# Patient Record
Sex: Female | Born: 1985 | Race: Black or African American | Hispanic: No | Marital: Single | Smoking: Never smoker
Health system: Southern US, Community
[De-identification: ages and names within clinical notes are randomized; demographics above are authoritative.]

## PROBLEM LIST (undated history)

## (undated) DIAGNOSIS — K219 Gastro-esophageal reflux disease without esophagitis: Secondary | ICD-10-CM

## (undated) DIAGNOSIS — E669 Obesity, unspecified: Secondary | ICD-10-CM

## (undated) DIAGNOSIS — R7303 Prediabetes: Secondary | ICD-10-CM

## (undated) HISTORY — DX: Obesity, unspecified: E66.9

## (undated) HISTORY — DX: Gastro-esophageal reflux disease without esophagitis: K21.9

## (undated) HISTORY — DX: Prediabetes: R73.03

---
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
## 2003-02-09 ENCOUNTER — Ambulatory Visit (HOSPITAL_COMMUNITY): Admission: RE | Admit: 2003-02-09 | Discharge: 2003-02-09 | Payer: Self-pay | Admitting: Obstetrics and Gynecology

## 2003-02-09 ENCOUNTER — Encounter: Payer: Self-pay | Admitting: Obstetrics and Gynecology

## 2003-03-11 ENCOUNTER — Other Ambulatory Visit: Admission: RE | Admit: 2003-03-11 | Discharge: 2003-03-11 | Payer: Self-pay | Admitting: Obstetrics and Gynecology

## 2003-06-05 ENCOUNTER — Inpatient Hospital Stay (HOSPITAL_COMMUNITY): Admission: AD | Admit: 2003-06-05 | Discharge: 2003-06-08 | Payer: Self-pay | Admitting: Obstetrics and Gynecology

## 2003-06-05 ENCOUNTER — Encounter: Payer: Self-pay | Admitting: Obstetrics and Gynecology

## 2005-05-08 ENCOUNTER — Other Ambulatory Visit: Admission: RE | Admit: 2005-05-08 | Discharge: 2005-05-08 | Payer: Self-pay | Admitting: Obstetrics and Gynecology

## 2005-05-18 ENCOUNTER — Encounter: Admission: RE | Admit: 2005-05-18 | Discharge: 2005-08-16 | Payer: Self-pay | Admitting: Obstetrics and Gynecology

## 2005-12-12 ENCOUNTER — Emergency Department (HOSPITAL_COMMUNITY): Admission: EM | Admit: 2005-12-12 | Discharge: 2005-12-12 | Payer: Self-pay | Admitting: Family Medicine

## 2005-12-25 ENCOUNTER — Ambulatory Visit: Payer: Self-pay | Admitting: Family Medicine

## 2006-05-16 ENCOUNTER — Other Ambulatory Visit: Admission: RE | Admit: 2006-05-16 | Discharge: 2006-05-16 | Payer: Self-pay | Admitting: Obstetrics and Gynecology

## 2008-11-02 ENCOUNTER — Inpatient Hospital Stay (HOSPITAL_COMMUNITY): Admission: RE | Admit: 2008-11-02 | Discharge: 2008-11-05 | Payer: Self-pay | Admitting: Obstetrics & Gynecology

## 2009-07-18 ENCOUNTER — Ambulatory Visit (HOSPITAL_COMMUNITY): Admission: RE | Admit: 2009-07-18 | Discharge: 2009-07-18 | Payer: Self-pay | Admitting: Obstetrics and Gynecology

## 2009-08-15 ENCOUNTER — Ambulatory Visit (HOSPITAL_COMMUNITY): Admission: RE | Admit: 2009-08-15 | Discharge: 2009-08-15 | Payer: Self-pay | Admitting: Obstetrics and Gynecology

## 2009-09-05 ENCOUNTER — Emergency Department (HOSPITAL_COMMUNITY): Admission: EM | Admit: 2009-09-05 | Discharge: 2009-09-05 | Payer: Self-pay | Admitting: Family Medicine

## 2009-09-12 ENCOUNTER — Inpatient Hospital Stay (HOSPITAL_COMMUNITY): Admission: AD | Admit: 2009-09-12 | Discharge: 2009-09-12 | Payer: Self-pay | Admitting: Obstetrics and Gynecology

## 2009-11-01 ENCOUNTER — Inpatient Hospital Stay (HOSPITAL_COMMUNITY): Admission: AD | Admit: 2009-11-01 | Discharge: 2009-11-03 | Payer: Self-pay | Admitting: Obstetrics and Gynecology

## 2010-02-13 ENCOUNTER — Encounter: Admission: RE | Admit: 2010-02-13 | Discharge: 2010-02-13 | Payer: Self-pay | Admitting: Obstetrics and Gynecology

## 2010-04-19 ENCOUNTER — Encounter: Admission: RE | Admit: 2010-04-19 | Discharge: 2010-07-18 | Payer: Self-pay | Admitting: Obstetrics and Gynecology

## 2010-12-24 LAB — CBC
HCT: 31.8 % — ABNORMAL LOW (ref 36.0–46.0)
HCT: 34.3 % — ABNORMAL LOW (ref 36.0–46.0)
Hemoglobin: 10.5 g/dL — ABNORMAL LOW (ref 12.0–15.0)
Hemoglobin: 11.3 g/dL — ABNORMAL LOW (ref 12.0–15.0)
MCHC: 32.9 g/dL (ref 30.0–36.0)
MCHC: 33 g/dL (ref 30.0–36.0)
MCV: 81.9 fL (ref 78.0–100.0)
MCV: 83.2 fL (ref 78.0–100.0)
Platelets: 238 10*3/uL (ref 150–400)
Platelets: 276 10*3/uL (ref 150–400)
RBC: 3.82 MIL/uL — ABNORMAL LOW (ref 3.87–5.11)
RBC: 4.19 MIL/uL (ref 3.87–5.11)
RDW: 14.3 % (ref 11.5–15.5)
RDW: 15.1 % (ref 11.5–15.5)
WBC: 7.8 10*3/uL (ref 4.0–10.5)
WBC: 9.9 10*3/uL (ref 4.0–10.5)

## 2010-12-24 LAB — RPR: RPR Ser Ql: NONREACTIVE

## 2011-01-22 LAB — RPR: RPR Ser Ql: NONREACTIVE

## 2011-01-22 LAB — CBC
HCT: 31.5 % — ABNORMAL LOW (ref 36.0–46.0)
HCT: 33.1 % — ABNORMAL LOW (ref 36.0–46.0)
Hemoglobin: 10.5 g/dL — ABNORMAL LOW (ref 12.0–15.0)
Hemoglobin: 11 g/dL — ABNORMAL LOW (ref 12.0–15.0)
MCHC: 33.2 g/dL (ref 30.0–36.0)
MCHC: 33.3 g/dL (ref 30.0–36.0)
MCV: 82.7 fL (ref 78.0–100.0)
MCV: 82.7 fL (ref 78.0–100.0)
Platelets: 212 10*3/uL (ref 150–400)
Platelets: 254 10*3/uL (ref 150–400)
RBC: 3.81 MIL/uL — ABNORMAL LOW (ref 3.87–5.11)
RBC: 4 MIL/uL (ref 3.87–5.11)
RDW: 13.9 % (ref 11.5–15.5)
RDW: 14.1 % (ref 11.5–15.5)
WBC: 8.5 10*3/uL (ref 4.0–10.5)
WBC: 9.6 10*3/uL (ref 4.0–10.5)

## 2011-02-20 NOTE — H&P (Signed)
NAME:  DEVONNA, OBOYLE             ACCOUNT NO.:  [id number]   MEDICAL RECORD NO.:  [id number]          PATIENT TYPE:  INP   LOCATION:  9168                          FACILITY:  [facility]   PHYSICIAN:  Osborn Coho, M.D.   DATE OF BIRTH:  08/22/1986   DATE OF ADMISSION:  11/02/2008  DATE OF DISCHARGE:                              HISTORY & PHYSICAL   Ms. Mcclaran is a 25 year old G4, P 2-0-1-2 at 40.2 weeks who presents  today for induction of labor secondary to history of LGA at 9 pounds 13  ounces.  Ms. Willingham is followed by the midwives at [facility]  [facility].  Her pregnancy is remarkable for:  1. Increased BMI of May with a current weight at about 290 pounds.  2. 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  3. History of LGA at 9 pounds 13 ounces.  4. Positive sickle cell trait.  5. History of STDs.  6. Recurrent UTIs this pregnancy.   PRENATAL LABS:  Ms. Lowy's prenatal labs include initial hemoglobin of  11.4, hematocrit 35.1, platelets 246.  Blood type O positive, antibody  screen negative, positive sickle cell trait, nonreactive RPR, rubella  immune, hepatitis B negative, HIV nonreactive.  Her glucose challenge at  27 weeks was normal with the finding of 97.  Her chlamydia, gonorrhea  and GBS cultures were negative at 35 weeks.   HISTORY OF PRESENT PREGNANCY:  Ms. Scibilia entered prenatal care at [facility]  in the second trimester at 14-1/2 weeks.  She did on combined oral  contraceptives, Seasonale.  She did have some vaginal spotting an  urinary tract of infection at 17 weeks.  She had a normal anatomy scan  at 19 weeks with a complete anatomy seen on cardiac, three-vessel cord,  placenta away from the os and size equal to dates with the with the  fetus.  She had issues with weight gain being very erratic.  She would  gain 20 pounds in a month and then she would use some weight.  She  really had trouble with her nutrition during pregnancy.  At 27 weeks and  she  had a Glucola which was normal at 97 and an H1N1 vaccine.  At 35  weeks she had the negative cultures and an ultrasound for size greater  than dates which showed estimated fetal weight 5 pounds 5 ounces which  was at 46the percentile; AFI 12.4, 35th percentile and a BPP of 8/8.  She just carries her weight mostly in her middle section which was  giving [place] difficulties in assessing the fundal height.  Last month of  pregnancy has been unremarkable for Ms. Hartt.  She was concerned about  going past her due date and having a baby that might weight was than 10  pounds, so on January 19 we decided to go ahead and put her down for an  induction today and since her cervix was closed and soft and posterior  we are beginning with the Cytotec.   OB HISTORY:  Gravida #1 for Ms. Arnett resulted in a son in July 2003.  He  was born at 42 weeks vaginally without complications.  Rhett Bannister #2 was  another son born in August 2004 at 40 weeks, born vaginally without  complications weighing 9 pounds 13 ounces.  Rhett Bannister #3 was a loss around  6 weeks' gestation in January 2009.  Rhett Bannister #4 is current pregnancy.   ALLERGIES:  Ms. Amoroso has no known medication allergies, food allergies  or latex allergies.   MEDICAL HISTORY:  Ms. Lein has a history of abnormal Pap in the past  year with CIN-I and positive high-risk HPV.  She is morbidly obese at  approximately 290 pounds and 5 feet 5 inches tall.  She is positive for  sickle cell trait.  She has a history of sexually transmitted diseases.   SURGICAL HISTORY:  She has never had surgery.   FAMILY HISTORY:  Her maternal grandmother has chronic hypertension and  diabetes the requires insulin management.   GENETIC HISTORY:  She is positive for sickle cell trait.  Father of the  baby has no contributory genetic history.   SOCIAL HISTORY:  She is a single [place] American woman who is currently  biology major and college.  The father of baby is listed as  Kathreen Cosier.  There is no other information listed for him.  She denies any  use of alcohol, tobacco products or street drugs during this pregnancy.  She does not state a religion.   PHYSICAL EXAMINATION:  She is afebrile with stable vital signs.  HEENT:  Within normal limits.  LUNGS:  Clear to auscultation bilaterally.  HEART:  Regular rate and rhythm.  No murmur.  EXTREMITIES:  Without edema.  DTRs +2, +2.  Negative Homan's.  Positive fetal movement.  Fetal heart rate 135, reactive, reassuring.  Irregular mild contractions of the uterus.  Vaginal exam closed, soft,  posterior 30% effaced.   IMPRESSION:  A 25 year old G4, P 2-0-1-2 at 40.2 weeks with a history of  large for gestational age infant, okay for induction of labor with  Cytotec.   PLAN:  Cytotec 200 mcg and posterior fornix q.4 hours until no Bishop  score reasonable for transition to Pitocin.  Anticipate NSVD later on  today.      Eulogio Bear, CNM      Osborn Coho, M.D.  Electronically Signed    JM/MEDQ  D:  11/02/2008  T:  11/02/2008  Job:  [id number]

## 2011-02-23 NOTE — H&P (Signed)
[facility]  Patient:    Morgan Dudley, Morgan Dudley Visit Number: [id number] MRN: [id number]          Service Type: OBS Location: [id number] [id number] Attending Physician:  Jaymes Graff A Dictated by:   Saverio Danker, C.N.M. Admit Date:  04/10/2002                           History and Physical  HISTORY OF PRESENT ILLNESS:   The patient is a 25 year old single black female, gravida 1, para 0, at 41-5/7ths weeks by 30-week ultrasound who is admitted for induction of labor secondary to postdates.  She denies any regular uterine contractions.  She denies any nausea, vomiting, headaches or visual disturbances.  She denies any leaking or vaginal bleeding.  She reports positive fetal movement.  Her pregnancy has been followed at [facility] since about [redacted] weeks gestation and has been at risk for (1) limited prenatal care, (2) adolescence, (3) positive sickle cell trait, (4) history of positive Chlamydia at 30 weeks that was treated, and (5) unknown LMP.  Her group B strep is negative.  OBSTETRICAL/GYNECOLOGICAL HISTORY:                      She is a gravida 1, para 0, with an LMP somewhere in December of 2002 with a history of very irregular periods and her EDC is established by 30-week ultrasound and is March 29, 2002.  OTHER GYNECOLOGICAL HISTORY:  She was treated for Chlamydia with this pregnancy, and she has no other significant GYN history.  GENERAL MEDICAL HISTORY:      She has no known drug allergies.  She reports not being aware of whether she has had the usual childhood diseases.  She has no other medical problems that she admits to, though she did test positive for sickle cell trait.  FAMILY HISTORY:               Significant for maternal grandmother and grandfather with hypertension, on medications; maternal grandmother with anemia; maternal grandmother with adult-onset diabetes, on diet and p.o. medications; a maternal aunt with thyroid  disease; maternal grandmother with migraine headaches.  GENETIC HISTORY:              Negative.  SOCIAL HISTORY:               She is single, she has good support from her family.  She is currently a Consulting civil engineer at [employer].  She denies any illicit drug use, alcohol or smoking with this pregnancy.  PRENATAL LABORATORIES:        Her blood type is O positive.  Her antibody screen is negative.  Sickle cell trait is positive.  Syphilis is nonreactive. Rubella is immune.  Hepatitis B surface antigen is negative.  HIV is nonreactive.  GC was negative.  Chlamydia was positive and she was treated for that; the tests of cures are not on the current chart at this time.  Her Pap was negative with the exception of yeast.  Her one-hour Glucola was 100 and her 36-week beta strep was negative.  PHYSICAL EXAMINATION:  VITAL SIGNS:                  Her vital signs are stable, she is afebrile.  HEENT:  Grossly within normal limits.  HEART:                        Regular rhythm and rate.  CHEST:                        Clear.  BREASTS:                      Soft and nontender.  ABDOMEN:                      Gravid with very irregular mild uterine contractions.  Her fetal heart rate is reactive and reassuring.  GENITALIA:                    Her pelvic exam is 1 cm, thick, posterior and -2.  EXTREMITIES:                  Within normal limits.  ASSESSMENT: 1. Intrauterine pregnancy at 41-5/7ths weeks. 2. Unfavorable cervix. 3. Negative group B Streptococcus. 4. Sickle cell trait.  PLAN:                         Admit to labor and delivery for induction of labor.  The risks and benefits of the induction have been reviewed with the patient including the possibility of fetal distress and increased risk of cesarean section and the risk of postdates pregnancy were also reviewed.  The patient desires to proceed with induction.  Dr. Normand Sloop is notified of the patients  admission and induction plan. Dictated by:   Vance Gather Duplantis, C.N.M. Attending Physician:  Michael Litter DD:  04/10/02 TD:  04/11/02 Job: [id number]

## 2011-02-23 NOTE — H&P (Signed)
NAME:  Morgan Dudley, Morgan Dudley                       ACCOUNT NO.:  [id number]   MEDICAL RECORD NO.:  [id number]                   PATIENT TYPE:  INP   LOCATION:  9168                                 FACILITY:  [facility]   PHYSICIAN:  Naima A. Dillard, M.D.              DATE OF BIRTH:  Mar 13, 1986   DATE OF ADMISSION:  06/05/2003  DATE OF DISCHARGE:                                HISTORY & PHYSICAL   HISTORY:  This is an 25 year old, gravida 2, para 1-0-0-1 at 79 3/7 weeks  who presents for elective induction of labor secondary to favorable cervix  and macrosomia. Pregnancy has been followed by the nurse midwife service and  remarkable for 1) unsure dates, 2) late to care, 3) second trimester  Chlamydia with a negative test of cure, 4) sickle cell trait, 5) obesity, 6)  second pregnancy in less than 12 months, 7) group B strep negative.   OB HISTORY:  Remarkable for vaginal delivery in 2003 of a female infant at [redacted]  weeks gestation, weighing 7 pounds, 13 ounces with no complications other  than post date.   MEDICAL HISTORY:  Remarkably for second trimester Chlamydia which was  treated with Zithromax and had a negative test of cure.   FAMILY HISTORY:  Remarkable for a grandmother and grandfather with  hypertension, grandmother with diabetes and an aunt with thyroid disease.   PAST SURGICAL HISTORY:  Negative.   GENETIC HISTORY:  Unremarkable.   SOCIAL HISTORY:  The patient is single. The father of the baby, Jamar, is  reported to be supportive but is not currently present. She has two female  family members present. She does not report a religious affiliation and she  denies any alcohol, tobacco or drug use.   PRENATAL LABS:  Hemoglobin 11.2, platelets 259, blood type O positive,  antibiotic screen negative, sickle cell positive for trait, RPR nonreactive,  rubella immune, HBsAg negative, HIV negative. Pap test normal, gonorrhea  negative. Chlamydia negative after treatment. Group B strep  negative.  Glucola within normal limits. Quad screen deferred secondary to late care.   PHYSICAL EXAMINATION:  VITAL SIGNS:  Stable.  HEENT:  Within normal limits.  NECK:  Thyroid normal, not enlarged.  CHEST:  Clear to auscultation.  HEART:  Regular rate and rhythm.  ABDOMEN:  Gravid, vertex to Leopold's. Fetal monitor denotes reactive fetal  heart rate with uterine contractions every 2-4 minutes which are mild.  Ultrasound denotes EFW of 3752 to 3909 g. Placenta is anterior, grade 2,  cephalic presentation and AFI is 11.7.  CERVIX:  Cervical exam is to 70%, -1, -2, vertex soft and mid position.  EXTREMITIES:  Within normal limits.   ASSESSMENT:  1. Intrauterine pregnancy at term.  2. Macrosomia.  3. Fetal renal pyelectasis.  4. Desires elective induction of labor secondary to suspected macrosomia.   PLAN:  1. Admit to birthing suites, discussed with Dr. Normand Sloop.  2. Amniotomy  performed for clear fluid.  3. Intrauterine pressure catheter to monitor adequacy of labor.  4. Pitocin p.r.n. inadequate labor.  5. Epidural p.r.n.  6. Further orders to follow.     Marie L. Williams, C.N.M.                 Naima A. Normand Sloop, M.D.    MLW/MEDQ  D:  06/06/2003  T:  06/06/2003  Job:  [id number]

## 2011-04-16 IMAGING — US US OB FOLLOW-UP
1 series · 18 of 28 positions shown · non-contrast
Comparison: none

OBSTETRICAL ULTRASOUND:
 This ultrasound was performed in The [HOSPITAL], and the [facility] report will be stored to [REDACTED] PACS.  This report is also available in [HOSPITAL]?s accessANYware.

[Series 1: us ob follow-up · 84 acquisitions, 18 frames shown]
[im 1/84]
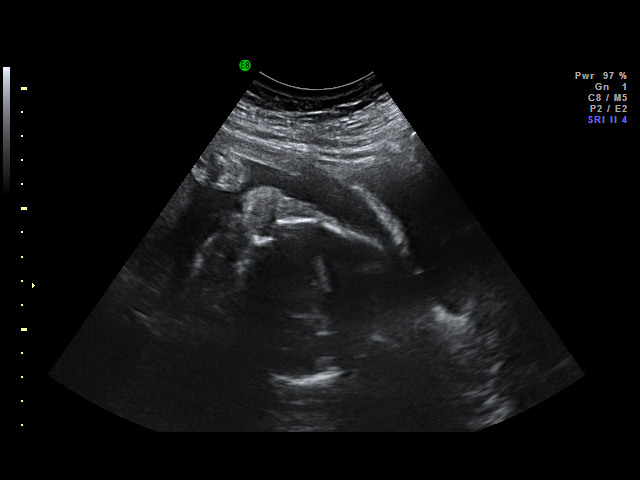
[im 7/84]
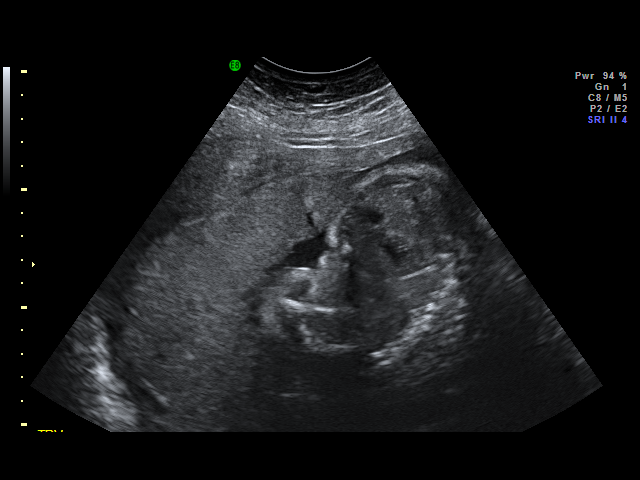
[im 10/84]
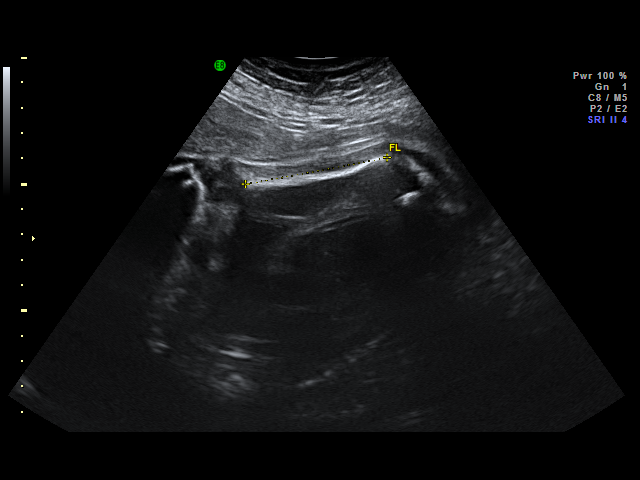
[im 16/84]
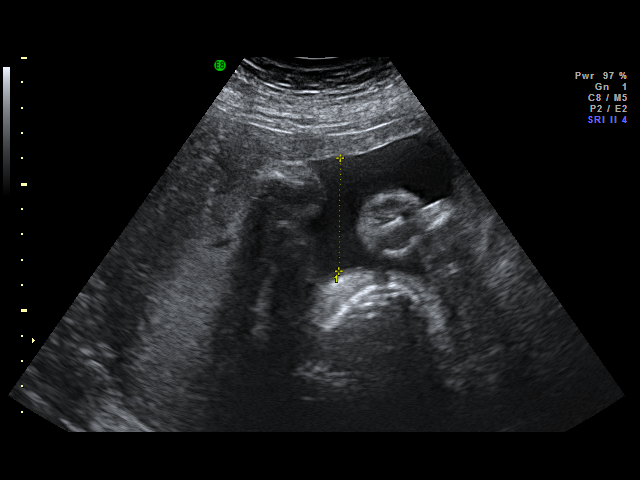
[im 22/84]
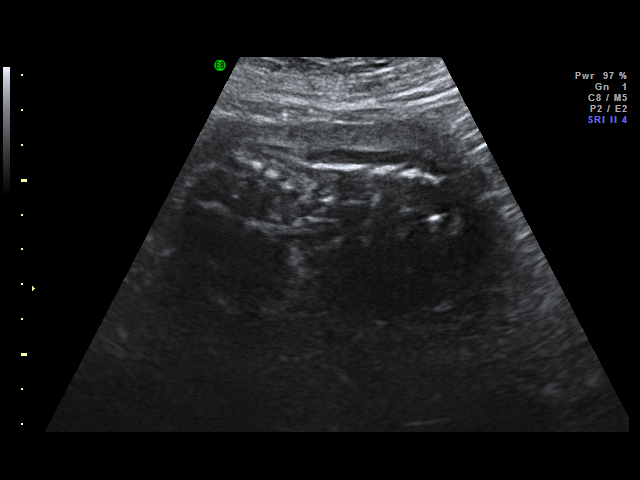
[im 25/84]
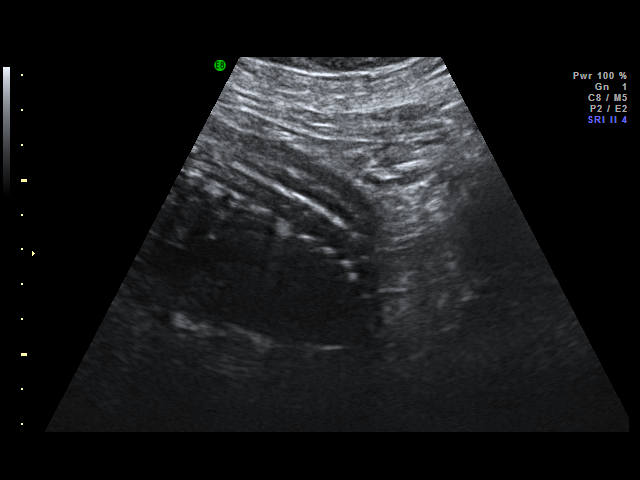
[im 31/84]
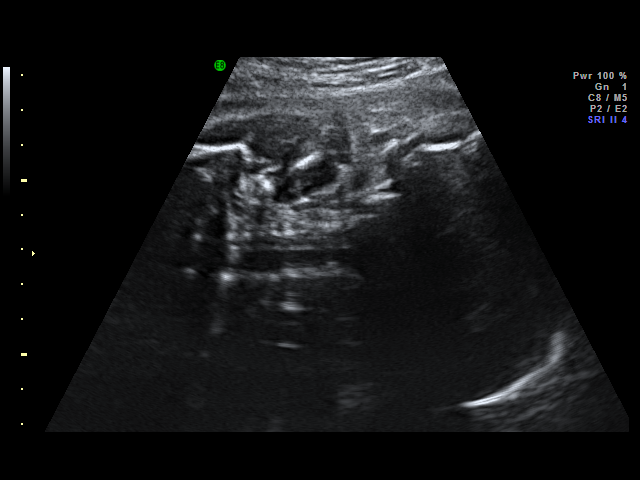
[im 34/84]
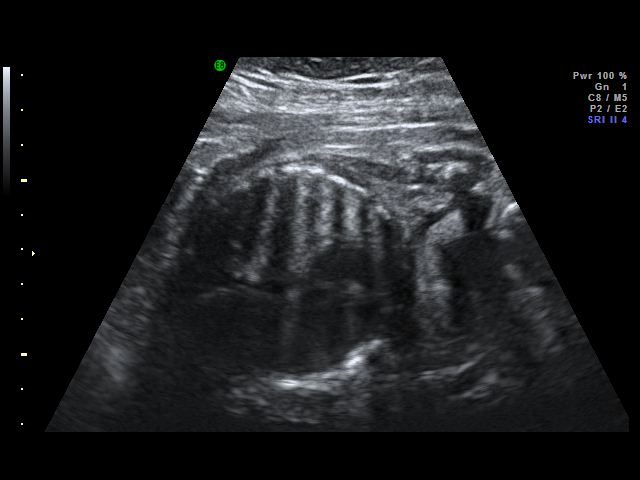
[im 40/84]
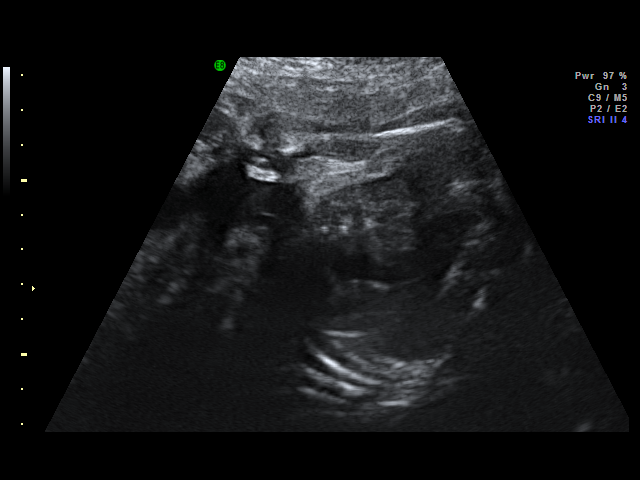
[im 44/84]
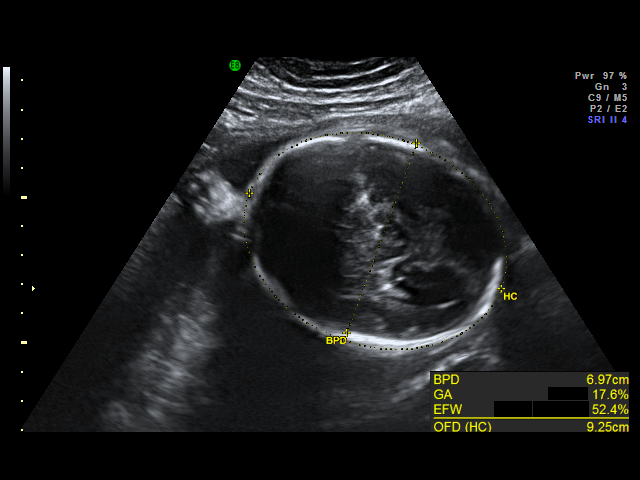
[im 50/84]
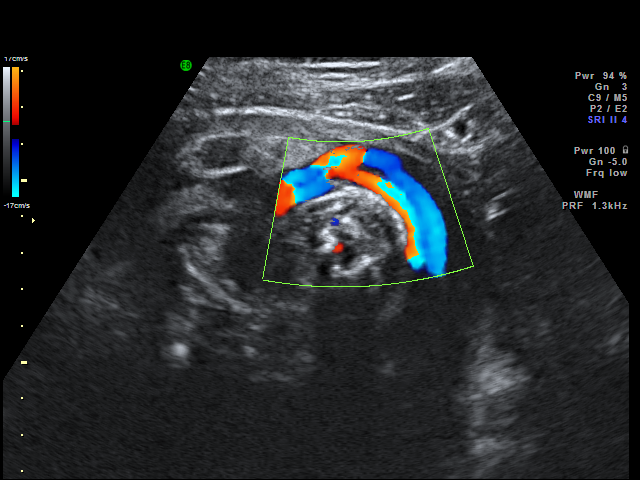
[im 53/84]
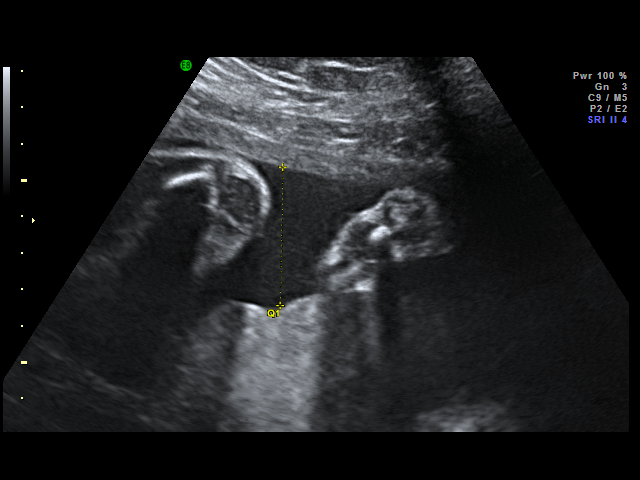
[im 59/84]
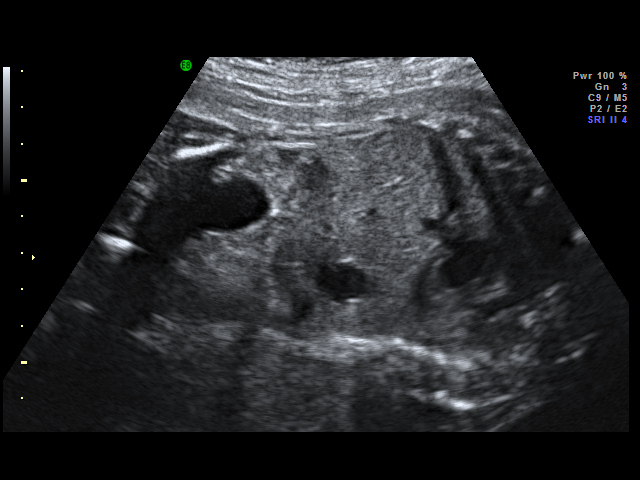
[im 65/84]
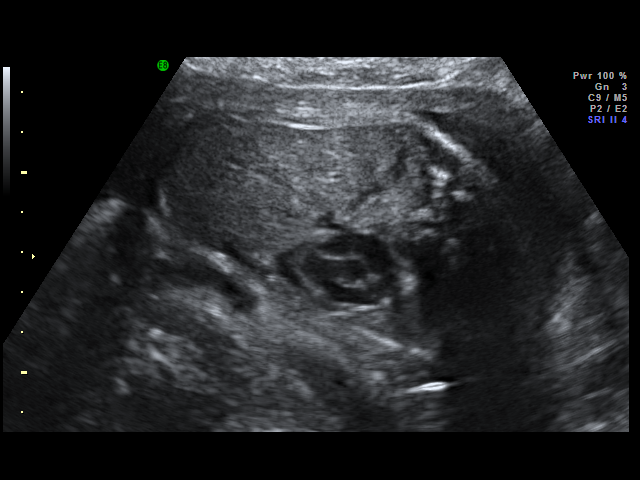
[im 68/84]
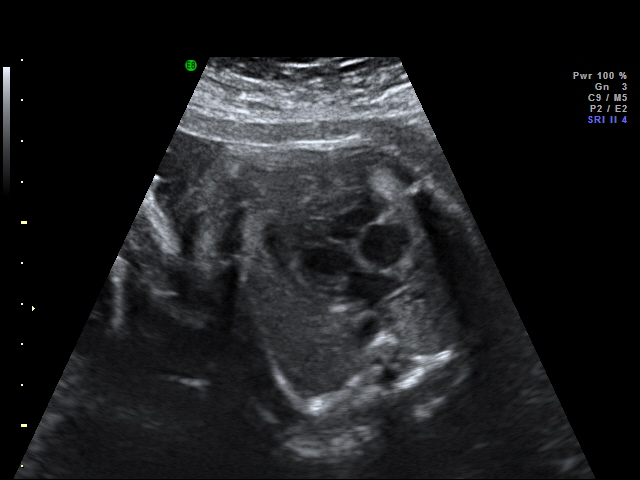
[im 74/84]
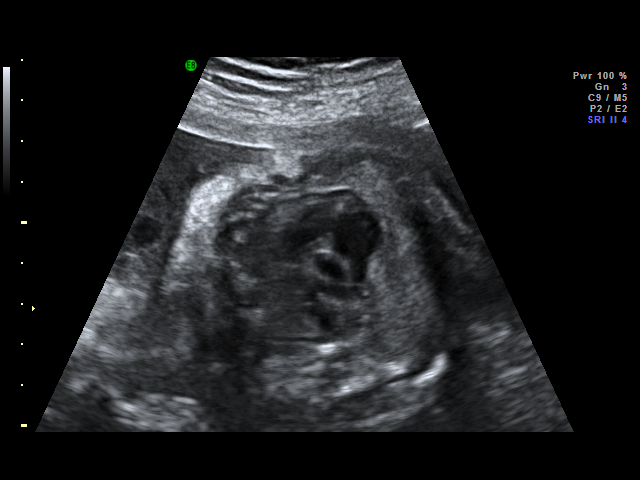
[im 77/84]
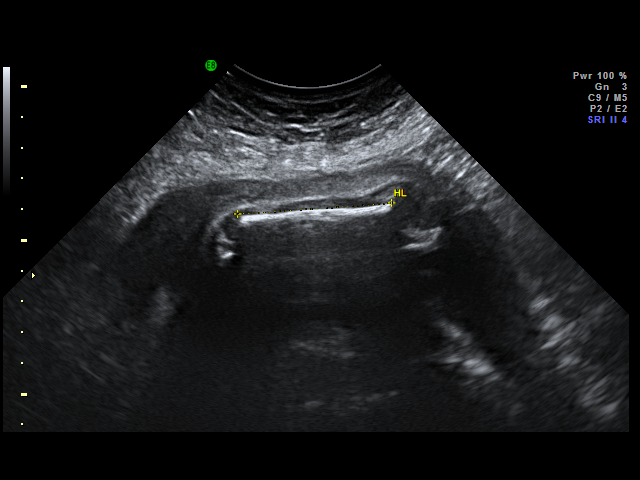
[im 84/84]
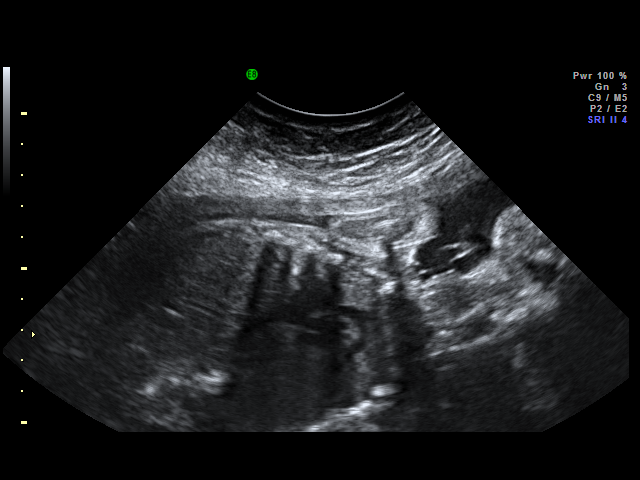

[18 of 28 positions shown; findings below may reference images not displayed]

IMPRESSION: [facility] has also been faxed to the ordering physician.

## 2011-08-13 ENCOUNTER — Ambulatory Visit: Payer: Self-pay | Admitting: *Deleted

## 2011-08-14 ENCOUNTER — Encounter: Payer: Self-pay | Admitting: *Deleted

## 2013-07-22 ENCOUNTER — Encounter: Payer: Self-pay | Admitting: Obstetrics

## 2013-08-11 ENCOUNTER — Ambulatory Visit: Payer: Self-pay | Admitting: Advanced Practice Midwife

## 2018-09-17 ENCOUNTER — Ambulatory Visit: Payer: Self-pay | Admitting: Family Medicine

## 2018-09-25 ENCOUNTER — Encounter: Payer: Self-pay | Admitting: Family Medicine

## 2018-09-25 ENCOUNTER — Ambulatory Visit (INDEPENDENT_AMBULATORY_CARE_PROVIDER_SITE_OTHER): Payer: Self-pay | Admitting: Family Medicine

## 2018-09-25 VITALS — BP 129/70 | HR 83 | Resp 17 | Ht 66.0 in | Wt 310.0 lb

## 2018-09-25 DIAGNOSIS — Z7689 Persons encountering health services in other specified circumstances: Secondary | ICD-10-CM

## 2018-09-25 DIAGNOSIS — Z23 Encounter for immunization: Secondary | ICD-10-CM

## 2018-09-25 MED ORDER — BUPROPION HCL ER (XL) 150 MG PO TB24: 150.0000 mg | ORAL_TABLET | Freq: Every day | ORAL | 2 refills | Status: DC

## 2018-09-25 MED FILL — BUPROPION HCL XL 150 MG TAB: 150 | 30 days supply | Qty: 30 | Fill #0

## 2018-09-25 NOTE — Patient Instructions (Addendum)
Thank you for choosing Primary Care at [facility] to be your medical home!    Morgan Dudley was seen by Joaquin Courts, FNP today.   Morgan Dudley's primary care provider is Bing Neighbors, FNP.   For the best care possible, you should try to see Joaquin Courts, FNP-C whenever you come to the clinic.   We look forward to seeing you again soon!  If you have any questions about your visit today, please call us at [phone] or feel free to reach your primary care provider via MyChart.      Calorie Counting for Weight Loss Calories are units of energy. Your body needs a certain amount of calories from food to keep you going throughout the day. When you eat more calories than your body needs, your body stores the extra calories as fat. When you eat fewer calories than your body needs, your body burns fat to get the energy it needs. Calorie counting means keeping track of how many calories you eat and drink each day. Calorie counting can be helpful if you need to lose weight. If you make sure to eat fewer calories than your body needs, you should lose weight. Ask your health care provider what a healthy weight is for you. For calorie counting to work, you will need to eat the right number of calories in a day in order to lose a healthy amount of weight per week. A dietitian can help you determine how many calories you need in a day and will give you suggestions on how to reach your calorie goal.  A healthy amount of weight to lose per week is usually 1-2 lb (0.5-0.9 kg). This usually means that your daily calorie intake should be reduced by 500-750 calories.  Eating 1,200 - 1,500 calories per day can help most women lose weight.  Eating 1,500 - 1,800 calories per day can help most men lose weight. What is my plan? My goal is to have __________ calories per day. If I have this many calories per day, I should lose around __________ pounds per week. What do I need to know about  calorie counting? In order to meet your daily calorie goal, you will need to:  Find out how many calories are in each food you would like to eat. Try to do this before you eat.  Decide how much of the food you plan to eat.  Write down what you ate and how many calories it had. Doing this is called keeping a food log. To successfully lose weight, it is important to balance calorie counting with a healthy lifestyle that includes regular activity. Aim for 150 minutes of moderate exercise (such as walking) or 75 minutes of vigorous exercise (such as running) each week. Where do I find calorie information?  The number of calories in a food can be found on a Nutrition Facts label. If a food does not have a Nutrition Facts label, try to look up the calories online or ask your dietitian for help. Remember that calories are listed per serving. If you choose to have more than one serving of a food, you will have to multiply the calories per serving by the amount of servings you plan to eat. For example, the label on a package of bread might say that a serving size is 1 slice and that there are 90 calories in a serving. If you eat 1 slice, you will have eaten 90 calories. If you eat 2  slices, you will have eaten 180 calories. How do I keep a food log? Immediately after each meal, record the following information in your food log:  What you ate. Don't forget to include toppings, sauces, and other extras on the food.  How much you ate. This can be measured in cups, ounces, or number of items.  How many calories each food and drink had.  The total number of calories in the meal. Keep your food log near you, such as in a small notebook in your pocket, or use a mobile app or website. Some programs will calculate calories for you and show you how many calories you have left for the day to meet your goal. What are some calorie counting tips?   Use your calories on foods and drinks that will fill you up and  not leave you hungry: ? Some examples of foods that fill you up are nuts and nut butters, vegetables, lean proteins, and high-fiber foods like whole grains. High-fiber foods are foods with more than 5 g fiber per serving. ? Drinks such as sodas, specialty coffee drinks, alcohol, and juices have a lot of calories, yet do not fill you up.  Eat nutritious foods and avoid empty calories. Empty calories are calories you get from foods or beverages that do not have many vitamins or protein, such as candy, sweets, and soda. It is better to have a nutritious high-calorie food (such as an avocado) than a food with few nutrients (such as a bag of chips).  Know how many calories are in the foods you eat most often. This will help you calculate calorie counts faster.  Pay attention to calories in drinks. Low-calorie drinks include water and unsweetened drinks.  Pay attention to nutrition labels for "low fat" or "fat free" foods. These foods sometimes have the same amount of calories or more calories than the full fat versions. They also often have added sugar, starch, or salt, to make up for flavor that was removed with the fat.  Find a way of tracking calories that works for you. Get creative. Try different apps or programs if writing down calories does not work for you. What are some portion control tips?  Know how many calories are in a serving. This will help you know how many servings of a certain food you can have.  Use a measuring cup to measure serving sizes. You could also try weighing out portions on a kitchen scale. With time, you will be able to estimate serving sizes for some foods.  Take some time to put servings of different foods on your favorite plates, bowls, and cups so you know what a serving looks like.  Try not to eat straight from a bag or box. Doing this can lead to overeating. Put the amount you would like to eat in a cup or on a plate to make sure you are eating the right  portion.  Use smaller plates, glasses, and bowls to prevent overeating.  Try not to multitask (for example, watch TV or use your computer) while eating. If it is time to eat, sit down at a table and enjoy your food. This will help you to know when you are full. It will also help you to be aware of what you are eating and how much you are eating. What are tips for following this plan? Reading food labels  Check the calorie count compared to the serving size. The serving size may be smaller than what  you are used to eating.  Check the source of the calories. Make sure the food you are eating is high in vitamins and protein and low in saturated and trans fats. Shopping  Read nutrition labels while you shop. This will help you make healthy decisions before you decide to purchase your food.  Make a grocery list and stick to it. Cooking  Try to cook your favorite foods in a healthier way. For example, try baking instead of frying.  Use low-fat dairy products. Meal planning  Use more fruits and vegetables. Half of your plate should be fruits and vegetables.  Include lean proteins like poultry and fish. How do I count calories when eating out?  Ask for smaller portion sizes.  Consider sharing an entree and sides instead of getting your own entree.  If you get your own entree, eat only half. Ask for a box at the beginning of your meal and put the rest of your entree in it so you are not tempted to eat it.  If calories are listed on the menu, choose the lower calorie options.  Choose dishes that include vegetables, fruits, whole grains, low-fat dairy products, and lean protein.  Choose items that are boiled, broiled, grilled, or steamed. Stay away from items that are buttered, battered, fried, or served with cream sauce. Items labeled "crispy" are usually fried, unless stated otherwise.  Choose water, low-fat milk, unsweetened iced tea, or other drinks without added sugar. If you want  an alcoholic beverage, choose a lower calorie option such as a glass of wine or light beer.  Ask for dressings, sauces, and syrups on the side. These are usually high in calories, so you should limit the amount you eat.  If you want a salad, choose a garden salad and ask for grilled meats. Avoid extra toppings like bacon, cheese, or fried items. Ask for the dressing on the side, or ask for olive oil and vinegar or lemon to use as dressing.  Estimate how many servings of a food you are given. For example, a serving of cooked rice is  cup or about the size of half a baseball. Knowing serving sizes will help you be aware of how much food you are eating at restaurants. The list below tells you how big or small some common portion sizes are based on everyday objects: ? 1 oz-4 stacked dice. ? 3 oz-1 deck of cards. ? 1 tsp-1 die. ? 1 Tbsp- a ping-pong ball. ? 2 Tbsp-1 ping-pong ball. ?  cup- baseball. ? 1 cup-1 baseball. Summary  Calorie counting means keeping track of how many calories you eat and drink each day. If you eat fewer calories than your body needs, you should lose weight.  A healthy amount of weight to lose per week is usually 1-2 lb (0.5-0.9 kg). This usually means reducing your daily calorie intake by 500-750 calories.  The number of calories in a food can be found on a Nutrition Facts label. If a food does not have a Nutrition Facts label, try to look up the calories online or ask your dietitian for help.  Use your calories on foods and drinks that will fill you up, and not on foods and drinks that will leave you hungry.  Use smaller plates, glasses, and bowls to prevent overeating. This information is not intended to replace advice given to you by your health care provider. Make sure you discuss any questions you have with your health care provider. Document Released: 09/24/2005  Document Revised: 06/13/2018 Document Reviewed: 08/24/2016 Elsevier Interactive Patient Education   [employer]2019 [employer]

## 2018-09-25 NOTE — Progress Notes (Signed)
Morgan BuergerVictoria Dudley, is a 32 y.o. female  YNW:[id number]:[id number]  MVH:[id number]:[id number]  DOB - 1986/06/12  CC:  Chief Complaint  Patient presents with  . Establish Care  . Obesity       HPI: Morgan Dudley is a 32 y.o. female is here today to establish care.   Morgan Dudley does not have a problem list on file.    Today's visit:  Obesity: Patient is concern of current level of obesity. Patient cites health as primary reason for wanting to lose weight.  Current Body mass index is 50.04 kg/m.  Obesity History Weight in late teens:160-170  lb. Period of greatest weight gain:  later adolescence after first pregnancy. Weight increased with each pregnancy Lowest adult weight: 200 Highest adult weight: current weight, 310 lbs Amount of time at present weight: > 10 year    History of Weight Loss Efforts Greatest amount of weight lost: 10-15 lb over few months Amount of time that loss was maintained: minimal months Circumstances associated with regain of weight: currently a full-time parent and full-time Consulting civil engineerstudent. Successful weight loss techniques attempted: exercise and diet shakes. Max weight loss 15 lbs and regain all of loss weight.  Current Exercise Habits cardiovascular workout on exercise equipment  Current Eating Habits Number of regular meals per day: 3 Number of snacking episodes per day: ocassionally  Binge behavior?: stress and need for use of hands, increases snacking Purge behavior? no Anorexic behavior? no Eating precipitated by stress? yes   No history of thyroid disease or any other metabolic disorder. Patient denies new headaches, chest pain, abdominal pain, nausea, new weakness , numbness or tingling, SOB, edema, or worrisome cough.   Current medications:None    Pertinent family medical history: mother prediabetes    No Known Allergies  Social History   Socioeconomic History  . Marital status: Single    Spouse name: Not on file  . Number of children: Not on file  .  Years of education: Not on file  . Highest education level: Not on file  Occupational History  . Not on file  Social Needs  . Financial resource strain: Not on file  . Food insecurity:    Worry: Not on file    Inability: Not on file  . Transportation needs:    Medical: Not on file    Non-medical: Not on file  Tobacco Use  . Smoking status: Never Smoker  . Smokeless tobacco: Never Used  Substance and Sexual Activity  . Alcohol use: Not Currently  . Drug use: Never  . Sexual activity: Not on file  Lifestyle  . Physical activity:    Days per week: Not on file    Minutes per session: Not on file  . Stress: Not on file  Relationships  . Social connections:    Talks on phone: Not on file    Gets together: Not on file    Attends religious service: Not on file    Active member of club or organization: Not on file    Attends meetings of clubs or organizations: Not on file    Relationship status: Not on file  . Intimate partner violence:    Fear of current or ex partner: Not on file    Emotionally abused: Not on file    Physically abused: Not on file    Forced sexual activity: Not on file  Other Topics Concern  . Not on file  Social History Narrative  . Not on file    Review of  Systems: Constitutional: Negative for fever, chills, diaphoresis, activity change, appetite change and fatigue. Respiratory: Negative for cough, choking, chest tightness, shortness of breath, wheezing and stridor.  Cardiovascular: Negative for chest pain, palpitations and leg swelling. Gastrointestinal: Negative for abdominal distention, nausea, vomiting, constipation Genitourinary: Negative for dysuria, urgency, frequency, hematuria, flank pain, decreased urine volume, difficulty urinating. Psychiatric/Behavioral: Negative for hallucinations, behavioral problems, confusion, dysphoric mood, decreased concentration and agitation.  Objective:   Vitals:   09/25/18 1356  BP: 129/70  Pulse: 83  Resp:  17  SpO2: 96%    BP Readings from Last 3 Encounters:  09/25/18 129/70    Filed Weights   09/25/18 1356  Weight: 210 lb (95.3 kg)      Physical Exam: Constitutional: Patient appears well-developed and well-nourished. No distress. HENT: Normocephalic, atraumatic, External right and left ear normal. Oropharynx is clear and moist.  Eyes: Conjunctivae and EOM are normal. PERRLA, no scleral icterus. Neck: Normal ROM. Neck supple. No JVD. No tracheal deviation. No thyromegaly. CVS: RRR, S1/S2 +, no murmurs, no gallops, no carotid bruit.  Pulmonary: Effort and breath sounds normal, no stridor, rhonchi, wheezes, rales.  Abdominal: Soft. BS +, no distension, tenderness, rebound or guarding.  Musculoskeletal: Normal range of motion. No edema and no tenderness.  Neuro: Alert. Normal muscle tone coordination. Normal gait. BUE and BLE strength 5/5. Bilateral hand grips symmetrical. No cranial nerve deficit. Skin: Skin is warm and dry. No rash noted. Not diaphoretic. No erythema. No pallor. Psychiatric: Normal mood and affect. Behavior, judgment, thought content normal.  Lab Results (prior encounters)  Lab Results  Component Value Date   WBC 9.9 11/03/2009   HGB 10.5 (L) 11/03/2009   HCT 31.8 (L) 11/03/2009   MCV 83.2 11/03/2009   PLT 238 11/03/2009      Assessment and plan:  1. Encounter to establish care 2. Class 3 severe obesity in adult, unspecified BMI, unspecified obesity type, unspecified whether serious comorbidity present (HCC) Encouraged efforts to reduce weight include engaging in physical activity as tolerated with goal of 150 minutes per week. Improve dietary choices and eat a meal regimen consistent with a Mediterranean or DASH diet. Reduce simple carbohydrates. Do not skip meals and eat healthy snacks throughout the day to avoid over-eating at dinner. Set a goal weight loss that is achievable for you. Make dinner your smallest meal. Breakfast and lunch should be larger  meals. Avoid eating after 7:00 pm or less tham 3 to 4 hours of bedtime. -Trial Wellbutrin 150 mg once daily for weight loss  Checking the following labs: - Thyroid Panel With TSH - Hemoglobin A1c  3. Need for immunization against influenza - Flu Vaccine QUAD 36+ mos IM    Return in 6 weeks weight loss management    Meds ordered this encounter  Medications  . DISCONTD: buPROPion (WELLBUTRIN XL) 150 MG 24 hr tablet    Sig: Take 1 tablet (150 mg total) by mouth daily.    Dispense:  30 tablet    Refill:  2  . buPROPion (WELLBUTRIN XL) 150 MG 24 hr tablet    Sig: Take 1 tablet (150 mg total) by mouth daily.    Dispense:  30 tablet    Refill:  2    Orders Placed This Encounter  Procedures  . Flu Vaccine QUAD 36+ mos IM  . Thyroid Panel With TSH  . Hemoglobin A1c     The patient was given clear instructions to go to ER or return to medical center if  symptoms don't improve, worsen or new problems develop. The patient verbalized understanding. The patient was advised  to call and obtain lab results if they haven't heard anything from out office within 7-10 business days.  Joaquin CourtsKimberly Danyael Alipio, FNP [facility] [address], [place] [id number] [phone]: [phone]    This note has been created with Dragon speech recognition software and Paediatric nursesmart phrase technology. Any transcriptional errors are unintentional.

## 2018-09-26 LAB — HEMOGLOBIN A1C
Est. average glucose Bld gHb Est-mCnc: 120 mg/dL
Hgb A1c MFr Bld: 5.8 % — ABNORMAL HIGH (ref 4.8–5.6)

## 2018-09-26 LAB — THYROID PANEL WITH TSH
Free Thyroxine Index: 1.8 (ref 1.2–4.9)
T3 Uptake Ratio: 22 % — ABNORMAL LOW (ref 24–39)
T4, Total: 8 ug/dL (ref 4.5–12.0)
TSH: 0.495 u[IU]/mL (ref 0.450–4.500)

## 2018-10-07 NOTE — Progress Notes (Signed)
Patient notified of results & recommendations. Expressed understanding.

## 2018-10-29 MED FILL — BUPROPION HCL XL 150 MG TAB: 150 | 30 days supply | Qty: 30 | Fill #1

## 2018-11-06 ENCOUNTER — Encounter: Payer: Self-pay | Admitting: Family Medicine

## 2018-11-06 ENCOUNTER — Ambulatory Visit: Payer: Self-pay | Attending: Family Medicine

## 2018-11-06 ENCOUNTER — Ambulatory Visit (INDEPENDENT_AMBULATORY_CARE_PROVIDER_SITE_OTHER): Payer: Self-pay | Admitting: Family Medicine

## 2018-11-06 VITALS — BP 126/74 | HR 85 | Resp 17 | Ht 66.0 in | Wt 309.8 lb

## 2018-11-06 DIAGNOSIS — Z713 Dietary counseling and surveillance: Secondary | ICD-10-CM

## 2018-11-06 DIAGNOSIS — Z6841 Body Mass Index (BMI) 40.0 and over, adult: Secondary | ICD-10-CM

## 2018-11-06 DIAGNOSIS — Z3202 Encounter for pregnancy test, result negative: Secondary | ICD-10-CM

## 2018-11-06 DIAGNOSIS — Z3042 Encounter for surveillance of injectable contraceptive: Secondary | ICD-10-CM

## 2018-11-06 LAB — POCT URINE PREGNANCY: Preg Test, Ur: NEGATIVE

## 2018-11-06 MED ORDER — BUPROPION HCL ER (XL) 300 MG PO TB24: 300.0000 mg | ORAL_TABLET | Freq: Every day | ORAL | 5 refills | Status: DC

## 2018-11-06 MED ORDER — MEDROXYPROGESTERONE ACETATE 150 MG/ML IM SUSP
150.0000 mg | Freq: Once | INTRAMUSCULAR | Status: AC
  Administered 2018-11-06: 150 mg via INTRAMUSCULAR

## 2018-11-06 NOTE — Patient Instructions (Signed)
Contact me here at the office once your financial assistance is approved and I will refer you to medical weight management.    Exercising to Lose Weight Exercise is structured, repetitive physical activity to improve fitness and health. Getting regular exercise is important for everyone. It is especially important if you are overweight. Being overweight increases your risk of heart disease, stroke, diabetes, high blood pressure, and several types of cancer. Reducing your calorie intake and exercising can help you lose weight. Exercise is usually categorized as moderate or vigorous intensity. To lose weight, most people need to do a certain amount of moderate-intensity or vigorous-intensity exercise each week. Moderate-intensity exercise  Moderate-intensity exercise is any activity that gets you moving enough to burn at least three times more energy (calories) than if you were sitting. Examples of moderate exercise include:  Walking a mile in 15 minutes.  Doing light yard work.  Biking at an easy pace. Most people should get at least 150 minutes (2 hours and 30 minutes) a week of moderate-intensity exercise to maintain their body weight. Vigorous-intensity exercise Vigorous-intensity exercise is any activity that gets you moving enough to burn at least six times more calories than if you were sitting. When you exercise at this intensity, you should be working hard enough that you are not able to carry on a conversation. Examples of vigorous exercise include:  Running.  Playing a team sport, such as football, basketball, and soccer.  Jumping rope. Most people should get at least 75 minutes (1 hour and 15 minutes) a week of vigorous-intensity exercise to maintain their body weight. How can exercise affect me? When you exercise enough to burn more calories than you eat, you lose weight. Exercise also reduces body fat and builds muscle. The more muscle you have, the more calories you burn.  Exercise also:  Improves mood.  Reduces stress and tension.  Improves your overall fitness, flexibility, and endurance.  Increases bone strength. The amount of exercise you need to lose weight depends on:  Your age.  The type of exercise.  Any health conditions you have.  Your overall physical ability. Talk to your health care provider about how much exercise you need and what types of activities are safe for you. What actions can I take to lose weight? Nutrition   Make changes to your diet as told by your health care provider or diet and nutrition specialist (dietitian). This may include: ? Eating fewer calories. ? Eating more protein. ? Eating less unhealthy fats. ? Eating a diet that includes fresh fruits and vegetables, whole grains, low-fat dairy products, and lean protein. ? Avoiding foods with added fat, salt, and sugar.  Drink plenty of water while you exercise to prevent dehydration or heat stroke. Activity  Choose an activity that you enjoy and set realistic goals. Your health care provider can help you make an exercise plan that works for you.  Exercise at a moderate or vigorous intensity most days of the week. ? The intensity of exercise may vary from person to person. You can tell how intense a workout is for you by paying attention to your breathing and heartbeat. Most people will notice their breathing and heartbeat get faster with more intense exercise.  Do resistance training twice each week, such as: ? Push-ups. ? Sit-ups. ? Lifting weights. ? Using resistance bands.  Getting short amounts of exercise can be just as helpful as long structured periods of exercise. If you have trouble finding time to exercise, try  to include exercise in your daily routine. ? Get up, stretch, and walk around every 30 minutes throughout the day. ? Go for a walk during your lunch break. ? Park your car farther away from your destination. ? If you take public  transportation, get off one stop early and walk the rest of the way. ? Make phone calls while standing up and walking around. ? Take the stairs instead of elevators or escalators.  Wear comfortable clothes and shoes with good support.  Do not exercise so much that you hurt yourself, feel dizzy, or get very short of breath. Where to find more information  U.S. Department of Health and Human Services: ThisPath.fiwww.hhs.gov  Centers for Disease Control and Prevention (CDC): FootballExhibition.com.brwww.cdc.gov Contact a health care provider:  Before starting a new exercise program.  If you have questions or concerns about your weight.  If you have a medical problem that keeps you from exercising. Get help right away if you have any of the following while exercising:  Injury.  Dizziness.  Difficulty breathing or shortness of breath that does not go away when you stop exercising.  Chest pain.  Rapid heartbeat. Summary  Being overweight increases your risk of heart disease, stroke, diabetes, high blood pressure, and several types of cancer.  Losing weight happens when you burn more calories than you eat.  Reducing the amount of calories you eat in addition to getting regular moderate or vigorous exercise each week helps you lose weight. This information is not intended to replace advice given to you by your health care provider. Make sure you discuss any questions you have with your health care provider. Document Released: 10/27/2010 Document Revised: 10/07/2017 Document Reviewed: 10/07/2017 Elsevier Interactive Patient Education  2019 [employer]

## 2018-11-10 ENCOUNTER — Encounter: Payer: Self-pay | Admitting: Family Medicine

## 2018-11-10 NOTE — Progress Notes (Signed)
Established Patient Office Visit  Subjective:  Patient ID: Morgan Dudley, female    DOB: Jul 21, 1986  Age: 33 y.o. MRN: [id number]  CC:  Chief Complaint  Patient presents with  . Weight Check  . Contraception    would like to restart Depo-Provera injection. currently on cycle    HPI [name] Rolston presents for weigh evaluation and contraception management  Weight Current Body mass index is 50 kg/m. Patient was seen in office 6 weeks ago. During that visit she was starting on Wellbutrin to assist with reducing cravings for foods. During that visit, patient's weight 310. Unfortunately, she hasn't lost any weight since that visit.  Continues to walk on average 30 minutes per day. Has noticed increase energy with Wellbutrin and a "difference" since taking medication. She is open to continuing medication at an increased dose. Recent TSH normal. Hemoglobin A1C indicated prediabetes at 5.8.   Contraception Management Previously prescribed depo injection over 2 years ago. She has to discontinue given cost of injections due to no health insurance. She reports no issues while receiving injection. She recently started her menstrual cycle this week and would like to resume injection.   Family History  Problem Relation Age of Onset  . Obesity Mother        Prediabetes -no medication       Social History   Socioeconomic History  . Marital status: Single    Spouse name: Not on file  . Number of children: Not on file  . Years of education: Not on file  . Highest education level: Not on file  Occupational History  . Not on file  Social Needs  . Financial resource strain: Not on file  . Food insecurity:    Worry: Not on file    Inability: Not on file  . Transportation needs:    Medical: Not on file    Non-medical: Not on file  Tobacco Use  . Smoking status: Never Smoker  . Smokeless tobacco: Never Used  Substance and Sexual Activity  . Alcohol use: Not Currently  . Drug  use: Never  . Sexual activity: Not on file  Lifestyle  . Physical activity:    Days per week: Not on file    Minutes per session: Not on file  . Stress: Not on file  Relationships  . Social connections:    Talks on phone: Not on file    Gets together: Not on file    Attends religious service: Not on file    Active member of club or organization: Not on file    Attends meetings of clubs or organizations: Not on file    Relationship status: Not on file  . Intimate partner violence:    Fear of current or ex partner: Not on file    Emotionally abused: Not on file    Physically abused: Not on file    Forced sexual activity: Not on file  Other Topics Concern  . Not on file  Social History Narrative  . Not on file    Outpatient Medications Prior to Visit  Medication Sig Dispense Refill  . buPROPion (WELLBUTRIN XL) 150 MG 24 hr tablet Take 1 tablet (150 mg total) by mouth daily. 30 tablet 2   No facility-administered medications prior to visit.     No Known Allergies  ROS Review of Systems Pertinent negatives listed in HPI   Objective:    Physical Exam  BP 126/74   Pulse 85   Resp  17   Ht 5\' 6"  (1.676 m)   Wt (!) 309 lb 12.8 oz (140.5 kg)   LMP 11/03/2018   SpO2 96%   BMI 50.00 kg/m     Wt Readings from Last 3 Encounters:  11/06/18 (!) 309 lb 12.8 oz (140.5 kg)  09/25/18 (!) 310 lb (140.6 kg)     Health Maintenance Due  Topic Date Due  . HIV Screening  09/30/1999  . TETANUS/TDAP  09/30/2003  . PAP SMEAR-Modifier  09/29/2005    There are no preventive care reminders to display for this patient.  Lab Results  Component Value Date   TSH 0.495 09/25/2018   Lab Results  Component Value Date   WBC 9.9 11/03/2009   HGB 10.5 (L) 11/03/2009   HCT 31.8 (L) 11/03/2009   MCV 83.2 11/03/2009   PLT 238 11/03/2009   Lab Results  Component Value Date   HGBA1C 5.8 (H) 09/25/2018      Assessment & Plan:  1. Depo-Provera contraceptive status -  medroxyPROGESTERone (DEPO-PROVERA) injection 150 mg - POCT urine pregnancy  2. Class 3 severe obesity in adult, unspecified BMI, unspecified obesity type, unspecified whether serious comorbidity present (HCC) -Increasing Wellbutrin 300 mg once daily -Offered the option of starting Metformin to assist in weight loss and to improve A1C. She would like to hold off on starting Metformin.   -Upon receipt of financial assistance, patient would like a referral to medical weight management.  -encouraged to continue engaging in physical activity as tolerated with goal of 150 minutes per week.  -Manage portions, replace cravings for sugar for healthier protein rich snacks.     Meds ordered this encounter  Medications  . medroxyPROGESTERone (DEPO-PROVERA) injection 150 mg  . buPROPion (WELLBUTRIN XL) 300 MG 24 hr tablet    Sig: Take 1 tablet (300 mg total) by mouth daily.    Dispense:  30 tablet    Refill:  5    Follow-up: 3 months follow-up.   Morgan Courts, FNP

## 2018-11-19 MED FILL — BUPROPION HCL XL 300 MG TAB: 300 | 30 days supply | Qty: 30 | Fill #0

## 2018-12-18 MED FILL — BUPROPION HCL XL 300 MG TAB: 300 | 30 days supply | Qty: 30 | Fill #1

## 2019-01-05 ENCOUNTER — Telehealth: Payer: Self-pay | Admitting: Family Medicine

## 2019-01-05 NOTE — Telephone Encounter (Signed)
Pt called in stating she never received her discount letter in the mail please follow up

## 2019-01-29 MED FILL — BUPROPION HCL XL 300 MG TAB: 300 | 30 days supply | Qty: 30 | Fill #2

## 2019-02-03 ENCOUNTER — Ambulatory Visit: Payer: Self-pay

## 2019-02-05 ENCOUNTER — Telehealth: Payer: Self-pay

## 2019-02-05 NOTE — Telephone Encounter (Signed)
Called patient to do their pre-visit COVID screening.  Have you recently traveled internationally([place], [place], [place], [place], [place]) or within the [place] to a hotspot area([place], [place], [place], [place], [place])? no  Are you currently experiencing any of the following: fever, cough, SHOB, fatigue? no  Have you been in contact with anyone who has recently travelled? no  Have you been in contact with anyone who is experiencing fever, cough, SHOB, fatigue or been diagnosed with COVID  or works in or has recently visited a SNF? No  Did let patient know that since she is out of the window to get her next Depo-Provera(April 17-May 1) we would have to repeat a POCT urine pregnancy prior to administering the shot.

## 2019-02-09 ENCOUNTER — Other Ambulatory Visit: Payer: Self-pay

## 2019-02-09 ENCOUNTER — Ambulatory Visit (INDEPENDENT_AMBULATORY_CARE_PROVIDER_SITE_OTHER): Payer: Self-pay

## 2019-02-09 DIAGNOSIS — Z3202 Encounter for pregnancy test, result negative: Secondary | ICD-10-CM

## 2019-02-09 DIAGNOSIS — Z1389 Encounter for screening for other disorder: Secondary | ICD-10-CM

## 2019-02-09 DIAGNOSIS — Z3042 Encounter for surveillance of injectable contraceptive: Secondary | ICD-10-CM

## 2019-02-09 LAB — POCT URINE PREGNANCY: Preg Test, Ur: NEGATIVE

## 2019-02-09 MED ORDER — MEDROXYPROGESTERONE ACETATE 150 MG/ML IM SUSP
150.0000 mg | Freq: Once | INTRAMUSCULAR | Status: AC
  Administered 2019-02-09: 150 mg via INTRAMUSCULAR

## 2019-02-09 NOTE — Progress Notes (Signed)
Patient here for Depo-Provera injection. Patient is out of the window(April 17-May 1) to receive her shot normally so a urine sample was collected to do a POCT urine pregnancy. Pregnancy test was negative. Administered Depo-Provera shot in R gluteus. Patient tolerated well. Next shot due between July 20th-August 3rd. KWalker, CMA.

## 2019-05-11 ENCOUNTER — Ambulatory Visit: Payer: Self-pay

## 2019-05-18 ENCOUNTER — Ambulatory Visit: Payer: Self-pay | Attending: Family Medicine

## 2019-05-18 ENCOUNTER — Other Ambulatory Visit: Payer: Self-pay

## 2019-05-19 ENCOUNTER — Other Ambulatory Visit: Payer: Self-pay

## 2019-05-19 ENCOUNTER — Encounter: Payer: Self-pay | Admitting: Internal Medicine

## 2019-05-19 ENCOUNTER — Ambulatory Visit (INDEPENDENT_AMBULATORY_CARE_PROVIDER_SITE_OTHER): Payer: Self-pay | Admitting: Internal Medicine

## 2019-05-19 DIAGNOSIS — H029 Unspecified disorder of eyelid: Secondary | ICD-10-CM

## 2019-05-19 DIAGNOSIS — Z3009 Encounter for other general counseling and advice on contraception: Secondary | ICD-10-CM

## 2019-05-19 MED ORDER — NORGESTIM-ETH ESTRAD TRIPHASIC 0.18/0.215/0.25 MG-25 MCG PO TABS: 1.0000 | ORAL_TABLET | Freq: Every day | ORAL | 4 refills | Status: DC

## 2019-05-19 NOTE — Progress Notes (Signed)
Virtual Visit via Telephone Note Due to current restrictions/limitations of in-office visits due to the COVID-19 pandemic, this scheduled clinical appointment was converted to a telehealth visit  I connected with Morgan Dudley on 05/19/19 at 12:09 p.m by telephone and verified that I am speaking with the correct person using two identifiers. I am in my office.  The patient is at home.  Only the patient and myself participated in this encounter.  I discussed the limitations, risks, security and privacy concerns of performing an evaluation and management service by telephone and the availability of in person appointments. I also discussed with the patient that there may be a patient responsible charge related to this service. The patient expressed understanding and agreed to proceed.   History of Present Illness: Pt with hx of obesity  Pt wanted to discuss other method of BC.   Depo but wants to change due to being over wgh. Was on BCP in the past and had changed to Depo because she did not want to have to remember to take pills.  Did not have much wgh gain while on BCP. Has researched birth control options and prefers to go back on BCP.    Wanting to get back to a healthier wgh. Placed on Wellbutrin to help with wgh loss by NP Molli Barrows.  Started on 150 mg with no improvement in wgh loss.  Dose increased to 300 mg but she started having headaches.  So she weaned herself off -walks 30 mins a day.  "I'm eating healthier but I need to pull back on my appetite."  Wellbutrin helped but caused headaches.  States she was suppose to be referred to [facility] but has not heard anything -Would like to be tried on a different medication to help suppress appetite and achieve some weight loss  Has a small bump on LT lower eye lid that first appear 1 yr ago.  Went down after given abx eye ointment.  Once she finished the ointment, it "came back up.."  It is not growing and does not  obstruct vision.  No drainage or pain.    Outpatient Encounter Medications as of 05/19/2019  Medication Sig  . [DISCONTINUED] buPROPion (WELLBUTRIN XL) 300 MG 24 hr tablet Take 1 tablet (300 mg total) by mouth daily. (Patient not taking: Reported on 05/19/2019)   No facility-administered encounter medications on file as of 05/19/2019.     Observations/Objective: No direct observation done as this was a telephone encounter  Assessment and Plan: 1. Family planning counseling Discussed methods of birth control.  With her being concerned about weight probably best option would be an IUD.  Patient states she looked at IUDs and Nexplanon implants but not interested in those.  She would like to be placed back on birth control pills.  She has not had any previous history of blood clots.  We went over risks of blood clots and signs and symptoms to watch out for. -Start Ortho Tri-Cyclen Lo  2. Morbid obesity ([place]) Dietary counseling given.  Encouraged her to continue regular exercise.  Would like for her to try to achieve at least 5 to 10 pound weight loss between now and on follow-up visit in person at which time we can discuss putting her on phentermine. -Once she has been approved for the orange card/cone discount, we can resubmit referral for medical weight management program  3. Lesion of left eyelid Possibly a stye.  Will take a look at it when  we see her in person   Follow Up Instructions: Follow-up in 3 weeks   I discussed the assessment and treatment plan with the patient. The patient was provided an opportunity to ask questions and all were answered. The patient agreed with the plan and demonstrated an understanding of the instructions.   The patient was advised to call back or seek an in-person evaluation if the symptoms worsen or if the condition fails to improve as anticipated.  I provided 22 minutes of non-face-to-face time during this encounter.   Jonah Blueeborah Axavier Pressley, MD

## 2019-05-19 NOTE — Progress Notes (Signed)
Previously on Depo-Provera for contraception. Says she was on it for about 19 years. Last shot was due between 04/27/2019-05/11/2019. Would like to discuss switching to an alternate medication to see if that would help her with weight loss.  Was on Bupropion to help curb appetite but had to stop it due to headaches. Wants to know if there's an alternative

## 2019-05-20 MED FILL — TRI-LO-MILI 0.18/0.215/0.25: 0.18/0.215/ | 28 days supply | Qty: 28 | Fill #0

## 2019-06-01 ENCOUNTER — Telehealth: Payer: Self-pay

## 2019-06-01 NOTE — Telephone Encounter (Signed)
Called patient to do their pre-visit COVID screening.  Have you been tested positive for COVID or are you currently waiting for COVID test results? no  Have you recently traveled internationally([place], [place], [place], [place], [place]) or within the [place] to a hotspot area([place], [place], [place], [place], [place])? no  Are you currently experiencing any of the following symptoms: fever, cough, SHOB, fatigue, body aches, loss of smell, rash, diarrhea, vomiting, severe headaches, weakness, sore throat? no  Have you been in contact with anyone who has recently travelled? no  Have you been in contact with anyone who is experiencing any of the above symptoms or been diagnosed with COVID  or works in or has recently visited a SNF? no  

## 2019-06-02 ENCOUNTER — Encounter: Payer: Self-pay | Admitting: Internal Medicine

## 2019-06-02 ENCOUNTER — Other Ambulatory Visit: Payer: Self-pay

## 2019-06-02 ENCOUNTER — Ambulatory Visit (INDEPENDENT_AMBULATORY_CARE_PROVIDER_SITE_OTHER): Payer: Self-pay | Admitting: Internal Medicine

## 2019-06-02 VITALS — BP 130/86 | HR 92 | Temp 97.7°F | Resp 17 | Wt 329.6 lb

## 2019-06-02 DIAGNOSIS — H00015 Hordeolum externum left lower eyelid: Secondary | ICD-10-CM

## 2019-06-02 DIAGNOSIS — R030 Elevated blood-pressure reading, without diagnosis of hypertension: Secondary | ICD-10-CM

## 2019-06-02 MED ORDER — ERYTHROMYCIN 5 MG/GM OP OINT: 1.0000 "application " | TOPICAL_OINTMENT | Freq: Every day | OPHTHALMIC | 0 refills | Status: DC

## 2019-06-02 MED FILL — TRI-LO-MILI 0.18/0.215/0.25: 0.18/0.215/ | 28 days supply | Qty: 28 | Fill #0

## 2019-06-02 MED FILL — ERYTHROMYCIN EYE OINTMENT: 5 | 7 days supply | Qty: 4 | Fill #0

## 2019-06-02 NOTE — Patient Instructions (Signed)
We will try you with the antibiotic ointment to use on the left lower eye lid daily for 1 week.  If this does not resolve, then please make an appointment with an ophthalmologist for further evaluation.  Follow a Healthy Eating Plan - You can do it! Limit sugary drinks.  Avoid sodas, sweet tea, sport or energy drinks, or fruit drinks.  Drink water, lo-fat milk, or diet drinks. Limit snack foods.   Cut back on candy, cake, cookies, chips, ice cream.  These are a special treat, only in small amounts. Eat plenty of vegetables.  Especially dark green, red, and orange vegetables. Aim for at least 3 servings a day. More is better! Include fruit in your daily diet.  Whole fruit is much healthier than fruit juice! Limit "white" bread, "white" pasta, "white" rice.   Choose "100% whole grain" products, brown or wild rice. Avoid fatty meats. Try "Meatless Monday" and choose eggs or beans one day a week.  When eating meat, choose lean meats like chicken, [place], and fish.  Grill, broil, or bake meats instead of frying, and eat poultry without the skin. Eat less salt.  Avoid frozen pizzas, frozen dinners and salty foods.  Use seasonings other than salt in cooking.  This can help blood pressure and keep you from swelling Beer, wine and liquor have calories.  If you can safely drink alcohol, limit to 1 drink per day for women, 2 drinks for men

## 2019-06-02 NOTE — Progress Notes (Signed)
Patient ID: Morgan Dudley, female    DOB: [birth year]  MRN: [id number]  CC: Eye Problem   Subjective: Morgan Dudley is a 33 y.o. female who presents for eval of eye lid and obesity Her concerns today include:  Hx of obesity  Has a small bump on LT lower eye lid that first appear 1 yr ago.  Went down after given abx eye ointment.  Once she finished the ointment, it "came back up.."  It is not growing and does not obstruct vision.  No drainage or pain.  She came today for me to have a look at it in person.  She has not started taking BCP as yet.  Did not realize until a few days ago that it was already at the pharmacy.    Obesity:  wgh up 20 lbs since 10/2018. She says her scale says 315 lbs.  Not getting out as much as before COVID.  Was walking daily but now doe so three days a wk.  She was in school  most of this yr and feels stress contributed.  Just finished classes July 25th.     "I try my best to eat healthy."   Drinks about 2.5 liters of water a day.  Drinks diet sodas.  -Would be interested in meeting with nutritionist for formal counseling.  She is still wanting to get into medical weight management program.  Patient Active Problem List   Diagnosis Date Noted  . Class 3 severe obesity in adult [facility](HCC) 10/05/2018     Current Outpatient Medications on File Prior to Visit  Medication Sig Dispense Refill  . Norgestimate-Ethinyl Estradiol Triphasic (ORTHO TRI-CYCLEN LO) 0.18/0.215/0.25 MG-25 MCG tab Take 1 tablet by mouth daily. (Patient not taking: Reported on 06/02/2019) 1 Package 4   No current facility-administered medications on file prior to visit.     No Known Allergies  Social History   Socioeconomic History  . Marital status: Single    Spouse name: Not on file  . Number of children: Not on file  . Years of education: Not on file  . Highest education level: Not on file  Occupational History  . Not on file  Social Needs  . Financial resource strain: Not on  file  . Food insecurity    Worry: Not on file    Inability: Not on file  . Transportation needs    Medical: Not on file    Non-medical: Not on file  Tobacco Use  . Smoking status: Never Smoker  . Smokeless tobacco: Never Used  Substance and Sexual Activity  . Alcohol use: Not Currently  . Drug use: Never  . Sexual activity: Not on file  Lifestyle  . Physical activity    Days per week: Not on file    Minutes per session: Not on file  . Stress: Not on file  Relationships  . Social Musicianconnections    Talks on phone: Not on file    Gets together: Not on file    Attends religious service: Not on file    Active member of club or organization: Not on file    Attends meetings of clubs or organizations: Not on file    Relationship status: Not on file  . Intimate partner violence    Fear of current or ex partner: Not on file    Emotionally abused: Not on file    Physically abused: Not on file    Forced sexual activity: Not on file  Other  Topics Concern  . Not on file  Social History Narrative  . Not on file    Family History  Problem Relation Age of Onset  . Obesity Mother        Prediabetes -no medication     No past surgical history on file.  ROS: Review of Systems Negative except as stated above  PHYSICAL EXAM: BP 130/86   Pulse 92   Temp 97.7 F (36.5 C) (Temporal)   Resp 17   Wt (!) 329 lb 9.6 oz (149.5 kg)   SpO2 96%   BMI 53.20 kg/m   Wt Readings from Last 3 Encounters:  06/02/19 (!) 329 lb 9.6 oz (149.5 kg)  11/06/18 (!) 309 lb 12.8 oz (140.5 kg)  09/25/18 (!) 310 lb (140.6 kg)    Physical Exam  General appearance - alert, well appearing, morbidly obese young African-American female and in no distress Mental status - normal mood, behavior, speech, dress, motor activity, and thought processes Eyes -small firm nodule on the right lower lid mid portion.  It does not appear inflamed.  It is not tender to touch. Chest - clear to auscultation, no wheezes,  rales or rhonchi, symmetric air entry Heart - normal rate, regular rhythm, normal S1, S2, no murmurs, rubs, clicks or gallops   No flowsheet data found. Lipid Panel  No results found for: CHOL, TRIG, HDL, CHOLHDL, VLDL, LDLCALC, LDLDIRECT  CBC    Component Value Date/Time   WBC 9.9 11/03/2009 0520   RBC 3.82 (L) 11/03/2009 0520   HGB 10.5 (L) 11/03/2009 0520   HCT 31.8 (L) 11/03/2009 0520   PLT 238 11/03/2009 0520   MCV 83.2 11/03/2009 0520   MCHC 33.0 11/03/2009 0520   RDW 15.1 11/03/2009 0520    ASSESSMENT AND PLAN: 1. Hordeolum externum of left lower eyelid Differential diagnosis here includes stye versus a chalazion versus other lesion such as skin cancer.  Since she did respond initially to my antibiotic ointment, we will try her with some erythromycin eye ointment again.  Patient told to use daily for 1 week.  Use warm compresses with this.  If it does not resolve I recommend that she see an ophthalmologist - erythromycin ophthalmic ointment; Place 1 application into the left eye at bedtime.  Dispense: 3.5 g; Refill: 0  2. Morbid obesity ([place]) We discussed healthy eating habits and the importance of getting in at least 150 minutes total per week of moderate intensity exercise. - Amb ref to Medical Nutrition Therapy-[facility] - Amb Ref to Medical Weight Management  3. Elevated blood pressure reading Low-salt diet encouraged.  Patient told that blood pressure will need to be monitored she starts taking birth control pills as these can adversely affect the blood pressure.    Patient was given the opportunity to ask questions.  Patient verbalized understanding of the plan and was able to repeat key elements of the plan.   Orders Placed This Encounter  Procedures  . Amb ref to Medical Nutrition Therapy-[facility]  . Amb Ref to Medical Weight Management     Requested Prescriptions   Signed Prescriptions Disp Refills  . erythromycin ophthalmic ointment 3.5 g 0    Sig: Place 1  application into the left eye at bedtime.    Return in about 2 months (around 08/02/2019).  Karle Plumber, MD, FACP

## 2019-06-18 ENCOUNTER — Encounter: Payer: Self-pay | Attending: Internal Medicine | Admitting: Skilled Nursing Facility1

## 2019-06-18 ENCOUNTER — Encounter: Payer: Self-pay | Admitting: Skilled Nursing Facility1

## 2019-06-18 ENCOUNTER — Other Ambulatory Visit: Payer: Self-pay

## 2019-06-18 NOTE — Patient Instructions (Addendum)
-  For the first week of logging every food and beverage that goes into your mouth Do NOT make any changes; take the avgerage calories in tehat week and subtract 500  -The next week you will be aiming for 500 less calories with:   -Non-starchy vegetables 2 times a day 7 days a week: made without sugar, butter, grease, alfredo, cheese, etc. If using oil measure out 1 tablespoon   -Use any seasoning you want for those veggies    -The goal is to only eat out 1 time a week or less   -Have a protein and carbohydrate 3 times a day

## 2019-06-18 NOTE — Progress Notes (Signed)
  Assessment:  Primary concerns today: obesity.   Pt state she needs guidance on how to eat.  A1C 5.8 Pt states she has always been heavy with her usual weight being about 260 pounds with a goal of 180 pounds.  Pt states she has tried just about all the diets.  Pt states she has a bowel movement about every other day but feels she should have one every day. Pt states she is up until about 11pm-12am waking at 7am and never feels rested and needs naps in the middle of the day: Dietitian advsied she speak with her doctor about sleep apnea Pt states she feels she has control over how she eats until 3pm but then loses that control and snacks the rest of the evening.  Pt stets when she was doing well with her weight she was confident and encouraged.  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  Pt states she has 4 kids.  MEDICATIONS: N/A   DIETARY INTAKE:  Usual eating pattern includes 3 meals and Excessive snacks per day.  Everyday foods include none stated.  Avoided foods include none stated.    24-hr recall:  B ( AM): 1 packet grits with skim milk and butter salt and pepper Snk ( AM):  Coffee with 4 packs of fake sugar with 1% milk L (12 PM): spagetti or lasagna or baked chicken with rice or potato Snk (3 PM): cravings occur: ritz crackers or candy or chips D (7 PM): spagetti or lasagna or baked chicken with rice or potato Snk ( PM): more craving snacking Beverages: diet coke,   Usual physical activity: ADL's  Progress Towards Goal(s):  In progress.     Intervention:  Nutrition counseling for weight loss. Goals: -For the first week of logging every food and beverage that goes into your mouth Do NOT make any changes; take the avgerage calories in tehat week and subtract 500 -The next week you will be aiming for 500 less calories with:  -Non-starchy vegetables 2 times a day 7 days a week: made without sugar, butter, grease, alfredo,  cheese, etc. If using oil measure out 1 tablespoon  -Use any seasoning you want for those veggies   -The goal is to only eat out 1 time a week or less  -Have a protein and carbohydrate 3 times a day RD's notes for next time: Any barriers to logging: Was eating balanced difficult? Were the kids on board with eating differently? Did you make any discoveries with your relationship with food? Were you able to identify the difference between appetite/cravings and hunger? Also, give her the handout defining the difference between hunger and appetite/craving  Teaching Method Utilized:  Visual Auditory Hands on  Handouts given during visit include:  Meal ideas  Should I eat  Barriers to learning/adherence to lifestyle change: excessive snacking at night  Demonstrated degree of understanding via:  Teach Back   Monitoring/Evaluation:  Dietary intake, exercise, and body weight prn.

## 2019-07-07 MED FILL — ERYTHROMYCIN EYE OINTMENT: 5 | 7 days supply | Qty: 4 | Fill #0

## 2019-07-07 MED FILL — TRI-LO-MILI 0.18/0.215/0.25: 0.18/0.215/ | 28 days supply | Qty: 28 | Fill #0

## 2019-07-08 ENCOUNTER — Encounter: Payer: Self-pay | Admitting: Dietician

## 2019-07-08 ENCOUNTER — Other Ambulatory Visit: Payer: Self-pay

## 2019-07-08 NOTE — Progress Notes (Signed)
Medical Nutrition Therapy  Follow-Up Visit Appt Start Time: 4:30pm   End Time: 5:15pm  Patient was originally seen on 06/18/2019 by Jon Gills, RD for weight management/obesity.  Primary concerns today: continued guidance on how to eat for weight management    NUTRITION ASSESSMENT   Lifestyle & Dietary Hx Pt has 4 kids. 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   Pt states she needs continued guidance on how to eat. States that she spent one week of tracking her food (as discussed at previous nutrition appointment) to determine her daily caloric intake, and found she was eating about 3,500 kcals/day. States that she then subtracted 500 kcals/day to then aim for 3,000 kcals/day the following week. States that she is worried that she is eating too many calories. Additionally, pt states she does not want to get "caught up" in the numbers and pay too much attention to calories.   States she is a little confused on how many times to incorporate carbs/protein/veggies throughout the day. Previously, pt would find herself snacking heavily starting around 2 or 3pm without much control of her intake. States she found a lot of her calories came from snacking. Over the past week, pt states that, instead of snacking, she finds something to distract herself such as walking on the treadmill she just purchased. Will walk for about 15 minutes then study or something else to keep her mind off of food until dinner time. States that she is not overly hungry by time to eat dinner either. Been incorporating more protein in at breakfast time, such as eggs.   24-Hr Dietary Recall First Meal: grits (or eggs)  Snack: none Second Meal: grilled chicken (or hamburger steak) + rice + green beans  Snack: none Third Meal: meat + starch + veggie  Snack: chips Beverages: water, diet coke     NUTRITION DIAGNOSIS Excessive energy intake (NI-1.3) related to food/nutrition knowledge deficit as evidenced by diet hx of snacking and  reported intake of ~3,500 kcals/day.    NUTRITION INTERVENTION  Nutrition education and counseling to facilitate patient's goals.   Today's visit involved continued education on putting together balanced meals. We discussed the importance of including protein and carbohydrates with meals and revisited the goal of having vegetables at least twice per day (filling up half of lunch/dinner plate with veggies.)    Breakfast- after discussing the importance of protein and carbs in blood sugar stabalization, pt expressed interest in making a goal to focus on eating better/balanced breakfasts, believes that this is a reason why she gets cravings in the afternoon (usually consumes small breakfasts and no protein.)  Patient had questions regarding "servings" of carbohydrates, and why it is recommended to include 2-3 servings of carbs per meal. We discussed carb counting, how 1 carb serving = 15 grams of carbs. Handout with carb servings was provided, pt believes this will be helpful in figuring out amounts of carbs to include with meals/snacks.   Body Composition Scale- pt requested to use this when offered, because she was curious as to how many calories her body "needs." We reviewed this during the visit and outlined certain goals, such as focusing on total body water percentage and maintaining lean body mass.   Handouts Provided Include   MyPlate & Meal Ideas   Breakfast Ideas   Carb Counting/ Servings (yellow card)   Body Composition Scale printout + Hydration sheet  Learning Style & Readiness for Change Teaching method utilized: Visual & Auditory  Demonstrated degree  of understanding via: Teach Back  Barriers to learning/adherence to lifestyle change: None Identified   Progress/ Updates on Pt's Goals . Working on Location manager meals  . Has been tracking intake via MyFitness Pal, pt states she may continue to do this here and there in the future to check in and see if she is on track   . NEW: focus on breakfast- balancing them with carbs and protein    MONITORING & EVALUATION Dietary intake, weekly physical activity, and goals in 1 month.  RD's Notes for Next Visit . Fluid/water intake  . Offer body comp scale again for comparison (if desired)   Next Steps  Patient is to return to [facility] for follow up visit in 1 month.

## 2019-08-11 ENCOUNTER — Encounter: Payer: Self-pay | Attending: Internal Medicine | Admitting: Skilled Nursing Facility1

## 2019-08-11 ENCOUNTER — Other Ambulatory Visit: Payer: Self-pay

## 2019-08-11 NOTE — Progress Notes (Signed)
Medical Nutrition Therapy  Follow-Up Visit Appt Start Time: 4:30pm   End Time: 5:15pm  Patient was originally seen on 06/18/2019 by Ubaldo Glassing, RD for weight management/obesity.  Primary concerns today: continued guidance on how to eat for weight management    NUTRITION ASSESSMENT   Lifestyle & Dietary Hx Pt has 4 kids. 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    Pt states she is most challenged by consistency.  Pt states she has been walking more and aiming for 10,000 having met that goal every day for 2 weeks. Pt states since walking more she has had more energy and has sore legs (in a good way). Pt states she no longer feels she needs to eat every hour by having a more substantial breakfast: cut back on snacking. Pt states she has been measuring he portion sizes of carbs. Pt states she is eating smaller amounts and still feels satisfied. Pt states the walks and playing with her daughter has helped to cut her snacking out.  Pt is logging in MyFitness pal: 2300 calories with 70% coming from carbohydrates and 6% coming from protein. Pt states she is fine with the numbers and finds it useful.   24-Hr Dietary Recall First Meal: grits (or eggs) with banana and milk or oatmeal with fruit  Snack: none Second Meal: grilled chicken (or hamburger steak) + rice + green beans  Snack: none Third Meal: meat + starch + veggie  Snack: chips Beverages: 10-12 cups water, diet coke    NUTRITION DIAGNOSIS Excessive energy intake (NI-1.3) related to food/nutrition knowledge deficit as evidenced by diet hx of snacking and reported intake of ~3,500 kcals/day.    NUTRITION INTERVENTION  Nutrition education and counseling to facilitate patient's goals.   Today's visit involved continued education on putting together balanced meals. We discussed the importance of including protein and carbohydrates with meals and revisited the goal of having vegetables at least twice per day (filling up half of lunch/dinner  plate with veggies.)    Breakfast- after discussing the importance of protein and carbs in blood sugar stabalization, pt expressed interest in making a goal to focus on eating better/balanced breakfasts, believes that this is a reason why she gets cravings in the afternoon (usually consumes small breakfasts and no protein.)  Patient had questions regarding "servings" of carbohydrates, and why it is recommended to include 2-3 servings of carbs per meal. We discussed carb counting, how 1 carb serving = 15 grams of carbs. Handout with carb servings was provided, pt believes this will be helpful in figuring out amounts of carbs to include with meals/snacks.   Macronutrient distribution  Appropriate calories for weight loss and hunger control   Handouts Provided Include     Learning Style & Readiness for Change Teaching method utilized: Visual & Auditory  Demonstrated degree of understanding via: Teach Back  Barriers to learning/adherence to lifestyle change: None Identified   Progress/ Updates on Pt's Goals . Working on Location manager meals  . Has been tracking intake via MyFitness Pal, pt states she may continue to do this here and there in the future to check in and see if she is on track  . focus on breakfast- balancing them with carbs and protein  . NEW: [place] [address] consistently  . NEW: consistently hit 35 minutes  . NEW: 2300-45-30-25 . NEW: utilize mindful eating for the holidays    MONITORING & EVALUATION Dietary intake, weekly physical activity, and goals in 1 month.  RD's Notes for Next Visit . Analyze the caloric distribution and if appropriate lower calories down to 1800 . Body comp scale  Next Steps  Patient is to return to [facility] for follow up visit in 6 weeks.

## 2019-08-18 MED FILL — TRI-LO-MILI 0.18/0.215/0.25: 0.18/0.215/ | 28 days supply | Qty: 28 | Fill #1

## 2019-09-11 MED FILL — TRI-LO-MILI 0.18/0.215/0.25: 0.18/0.215/ | 28 days supply | Qty: 28 | Fill #2

## 2019-09-21 ENCOUNTER — Encounter: Payer: Self-pay | Attending: Internal Medicine | Admitting: Skilled Nursing Facility1

## 2019-09-21 ENCOUNTER — Other Ambulatory Visit: Payer: Self-pay

## 2019-09-21 NOTE — Progress Notes (Signed)
Medical Nutrition Therapy  Follow-Up Visit Appt Start Time: 4:30pm   End Time: 5:15pm  Patient was originally seen on 06/18/2019 by Ubaldo Glassing, RD for weight management/obesity.  Primary concerns today: continued guidance on how to eat for weight management    NUTRITION ASSESSMENT   Lifestyle & Dietary Hx Pt has 4 kids. 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    Pt states she is most challenged by consistency.   Pt states the holidays has been challenging. Pt states prior to thanksgiving she followed her calories and on thanksgiving ate what she wanted. Pt states her calorie count has been about 2000 calories every once in a while 2100 with a macronutrient distribution of carbs 40%, fat 25%, protein 35%.  Body Composition Scale 09/21/2019  Total Body Fat % 49.6  Visceral Fat 18  Fat-Free Mass % 50.3   Total Body Water % 39.6   Muscle-Mass lbs 35.1  Body Fat Displacement          Torso  lbs 102.1         Left Leg  lbs 20.4         Right Leg  lbs 20.4         Left Arm  lbs 10.2         Right Arm   lbs 10.2      24-Hr Dietary Recall First Meal: grits (or eggs) with banana and milk or oatmeal with fruit  Snack: none or pistacio  Second Meal: grilled chicken (or hamburger steak) + rice + green beans  Snack: none or crackers Third Meal: meat + starch + veggie  Snack: chips or fruit  Beverages: 10-12 cups water, diet coke    NUTRITION DIAGNOSIS Excessive energy intake (NI-1.3) related to food/nutrition knowledge deficit as evidenced by diet hx of snacking and past reported intake of ~3,500 kcals/day.    NUTRITION INTERVENTION  Nutrition education and counseling to facilitate patient's goals.   Today's visit involved continued education on putting together balanced meals. We discussed the importance of including protein and carbohydrates with meals and revisited the goal of having vegetables at least twice per day (filling up half of lunch/dinner plate with veggies.)     Breakfast- after discussing the importance of protein and carbs in blood sugar stabalization, pt expressed interest in making a goal to focus on eating better/balanced breakfasts, believes that this is a reason why she gets cravings in the afternoon (usually consumes small breakfasts and no protein.)  Patient had questions regarding "servings" of carbohydrates, and why it is recommended to include 2-3 servings of carbs per meal. We discussed carb counting, how 1 carb serving = 15 grams of carbs. Handout with carb servings was provided, pt believes this will be helpful in figuring out amounts of carbs to include with meals/snacks.   Macronutrient distribution  Appropriate calories for weight loss and hunger control   Handouts Provided Include     Learning Style & Readiness for Change Teaching method utilized: Visual & Auditory  Demonstrated degree of understanding via: Teach Back  Barriers to learning/adherence to lifestyle change: None Identified   Progress/ Updates on Pt's Goals . Working on Location manager meals  . Has been tracking intake via MyFitness Pal, pt states she may continue to do this here and there in the future to check in and see if she is on track  . focus on breakfast- balancing them with carbs and protein  . increase [place] of [address] consistently  .  consistently hit 35 minutes 7 days a week   . NEW: Continue with 2000cal-45-30-25 . NEW: utilize mindful eating for the holidays  . NEW: Aim for 80 ounces of water   MONITORING & EVALUATION Dietary intake, weekly physical activity, and goals in 6 weeks  RD's Notes for Next Visit .   Next Steps  Patient is to return to [facility] for follow up visit in 6 weeks.

## 2019-10-12 MED FILL — TRI-LO-MILI 0.18/0.215/0.25: 0.18/0.215/ | 28 days supply | Qty: 28 | Fill #3

## 2019-10-21 ENCOUNTER — Encounter (INDEPENDENT_AMBULATORY_CARE_PROVIDER_SITE_OTHER): Payer: Self-pay | Admitting: Bariatrics

## 2019-10-21 ENCOUNTER — Other Ambulatory Visit: Payer: Self-pay

## 2019-10-21 ENCOUNTER — Ambulatory Visit (INDEPENDENT_AMBULATORY_CARE_PROVIDER_SITE_OTHER): Payer: Self-pay | Admitting: Bariatrics

## 2019-10-21 VITALS — BP 127/84 | HR 85 | Temp 98.6°F | Ht 65.0 in | Wt 337.0 lb

## 2019-10-21 DIAGNOSIS — R0602 Shortness of breath: Secondary | ICD-10-CM

## 2019-10-21 DIAGNOSIS — Z6841 Body Mass Index (BMI) 40.0 and over, adult: Secondary | ICD-10-CM

## 2019-10-21 DIAGNOSIS — R5383 Other fatigue: Secondary | ICD-10-CM

## 2019-10-21 DIAGNOSIS — K219 Gastro-esophageal reflux disease without esophagitis: Secondary | ICD-10-CM

## 2019-10-21 DIAGNOSIS — Z9189 Other specified personal risk factors, not elsewhere classified: Secondary | ICD-10-CM

## 2019-10-21 DIAGNOSIS — R7303 Prediabetes: Secondary | ICD-10-CM

## 2019-10-21 DIAGNOSIS — Z0289 Encounter for other administrative examinations: Secondary | ICD-10-CM

## 2019-10-21 DIAGNOSIS — F3289 Other specified depressive episodes: Secondary | ICD-10-CM

## 2019-10-21 DIAGNOSIS — E66813 Obesity, class 3: Secondary | ICD-10-CM

## 2019-10-22 ENCOUNTER — Encounter (INDEPENDENT_AMBULATORY_CARE_PROVIDER_SITE_OTHER): Payer: Self-pay | Admitting: Bariatrics

## 2019-10-22 DIAGNOSIS — E559 Vitamin D deficiency, unspecified: Secondary | ICD-10-CM | POA: Insufficient documentation

## 2019-10-22 DIAGNOSIS — R7303 Prediabetes: Secondary | ICD-10-CM | POA: Insufficient documentation

## 2019-10-22 LAB — VITAMIN D 25 HYDROXY (VIT D DEFICIENCY, FRACTURES): Vit D, 25-Hydroxy: 20.3 ng/mL — ABNORMAL LOW (ref 30.0–100.0)

## 2019-10-22 LAB — LIPID PANEL WITH LDL/HDL RATIO
Cholesterol, Total: 153 mg/dL (ref 100–199)
HDL: 55 mg/dL (ref >39)
LDL Chol Calc (NIH): 77 mg/dL (ref 0–99)
LDL/HDL Ratio: 1.4 ratio (ref 0.0–3.2)
Triglycerides: 119 mg/dL (ref 0–149)
VLDL Cholesterol Cal: 21 mg/dL (ref 5–40)

## 2019-10-22 LAB — HEMOGLOBIN A1C
Est. average glucose Bld gHb Est-mCnc: 131 mg/dL
Hgb A1c MFr Bld: 6.2 % — ABNORMAL HIGH (ref 4.8–5.6)

## 2019-10-22 LAB — COMPREHENSIVE METABOLIC PANEL
ALT: 16 IU/L (ref 0–32)
AST: 17 IU/L (ref 0–40)
Albumin/Globulin Ratio: 1.4 (ref 1.2–2.2)
Albumin: 3.9 g/dL (ref 3.8–4.8)
Alkaline Phosphatase: 86 IU/L (ref 39–117)
BUN/Creatinine Ratio: 17 (ref 9–23)
BUN: 11 mg/dL (ref 6–20)
Bilirubin Total: 0.3 mg/dL (ref 0.0–1.2)
CO2: 23 mmol/L (ref 20–29)
Calcium: 9.4 mg/dL (ref 8.7–10.2)
Chloride: 101 mmol/L (ref 96–106)
Creatinine, Ser: 0.64 mg/dL (ref 0.57–1.00)
GFR calc Af Amer: 134 mL/min/{1.73_m2} (ref >59)
GFR calc non Af Amer: 116 mL/min/{1.73_m2} (ref >59)
Globulin, Total: 2.7 g/dL (ref 1.5–4.5)
Glucose: 113 mg/dL — ABNORMAL HIGH (ref 65–99)
Potassium: 5.1 mmol/L (ref 3.5–5.2)
Sodium: 137 mmol/L (ref 134–144)
Total Protein: 6.6 g/dL (ref 6.0–8.5)

## 2019-10-22 LAB — INSULIN, RANDOM: INSULIN: 59.7 u[IU]/mL — ABNORMAL HIGH (ref 2.6–24.9)

## 2019-10-22 NOTE — Progress Notes (Signed)
Dear Morgan Blue, MD,   Thank you for referring Morgan Dudley to our clinic. The following note includes my evaluation and treatment recommendations.  Chief Complaint:   [name] (MR# [id number]) is a 34 y.o. female who presents for evaluation and treatment of obesity and related comorbidities. Current BMI is Body mass index is 56.08 kg/m.[name] has been struggling with her weight for many years and has been unsuccessful in either losing weight, maintaining weight loss, or reaching her healthy weight goal.  [name] states she is currently in the action stage of change and ready to dedicate time achieving and maintaining a healthier weight. [name] is interested in becoming our patient and working on intensive lifestyle modifications including (but not limited to) diet and exercise for weight loss.  [name] states she likes to [employer], but states obstacles as "knowing what is right for me."  She craves sugar and salty, savory foods.     Morgan's habits were reviewed today and are as follows: Her family eats meals together, she thinks her family will eat healthier with her, her desired weight loss is 163 pounds, she has been heavy most of her life, she started gaining weight at age 40, her heaviest weight ever was 343 pounds, she craves sugar, cookies, and chips, she snacks frequently in the evenings, she is frequently drinking liquids with calories, she frequently makes poor food choices, she frequently eats larger portions than normal and she struggles with emotional eating.  [name] should concentrate on meal planning, intentional eating, stopping all sugary drinks, and decreasing her portion sizes.  Depression Screen [name]'s Food and Mood (modified PHQ-9) score was 9.  Depression screen PHQ 2/9 10/21/2019  Decreased Interest 1  Down, Depressed, Hopeless 0  PHQ - 2 Score 1  Altered sleeping 2  Tired, decreased energy 3  Change in appetite 1    Feeling bad or failure about yourself  1  Trouble concentrating 0  Moving slowly or fidgety/restless 1  Suicidal thoughts 0  PHQ-9 Score 9  Difficult doing work/chores Somewhat difficult   Subjective:   1. Other fatigue [name] feels her energy is lower than it should be. This has worsened with weight gain and has worsened recently. [name] denies daytime somnolence and admits to waking up still tired. Patient is at risk for obstructive sleep apnea. Patient generally gets 6 hours of sleep per night, and states they generally have good quality sleep. Snoring is present. Apneic episodes are not present. Epworth Sleepiness Score is 6.  2. Shortness of breath on exertion [name] notes increasing shortness of breath with exercising and seems to be worsening over time with weight gain. She notes getting out of breath sooner with activity than she used to. This has gotten worse recently. [name] denies shortness of breath at rest or orthopnea.  3. Prediabetes [name] has a diagnosis of prediabetes based on her elevated HgA1c and was informed this puts her at greater risk of developing diabetes. She continues to work on diet and exercise to decrease her risk of diabetes. She denies nausea or hypoglycemia.  She admits to polyphagia in the afternoon and evening.  4. Gastroesophageal reflux disease, unspecified whether esophagitis present [name] thinks this is related to food choices.  She has taken Pepcid.  There is no pattern.  5. At risk for hypoglycemia [name] is at increased risk for hypoglycemia due to changes in diet, diagnosis of diabetes, and/or insulin use. Davonne is not not currently taking insulin.  6. Other depression, with emotional eating Morgan Dudley is struggling with emotional eating and using food for comfort to the extent that it is negatively impacting her health. She often snacks when she is not hungry. Morgan Dudley sometimes feels she is out of control and then feels guilty  that she made poor food choices. She has been working on behavior modification techniques to help reduce her emotional eating and has been unsuccessful. She shows no sign of suicidal or homicidal ideations.  Assessment/Plan:   1. Other fatigue Morgan Dudley was informed fatigue may be related to obesity, depression or many other causes. Labs will be ordered, and in the meanwhile Morgan Dudley has agreed to work on diet, exercise and weight loss. - EKG 12-Lead - Vitamin D (25 hydroxy)  2. Shortness of breath on exertion Jaimarie's shortness of breath appears to be obesity related and exercise induced. She has agreed to work on weight loss and gradually increase exercise to treat her exercise induced shortness of breath. Will continue to monitor closely. - Lipid Panel With LDL/HDL Ratio  3. Prediabetes Morgan Dudley will choose carbohydrates with healthy fat or protein.  I will check her A1c and insulin level today.  I have asked her to be sure to not skip meals.   - HgB A1c - Insulin, random - Comprehensive Metabolic Panel (CMET)  4. Gastroesophageal reflux disease, unspecified whether esophagitis present Morgan Dudley should be sure to decrease the acid level in the foods she eats and not eat before bed.  5. At risk for hypoglycemia Morgan Dudley was given approximately 15 minutes of counseling today regarding prevention of hypoglycemia. She was advised of symptoms of hypoglycemia. Morgan Dudley was instructed to eat regular meals.   6. Other depression, with emotional eating Behavior modification techniques were discussed today to help Morgan Dudley deal with her emotional/non-hunger eating behaviors.  Orders and follow up as documented in patient record.   7. Class 3 severe obesity with serious comorbidity and body mass index (BMI) of 50.0 to 59.9 in adult, unspecified obesity type ([place]) Morgan Dudley is currently in the action stage of change and her goal is to continue with weight loss efforts. I recommend Morgan Dudley begin  the structured treatment plan as follows:  She has agreed to start on the Category 4 Plan +200 calories.  We discussed the following exercise goals today: For substantial health benefits, adults should do at least 150 minutes (2 hours and 30 minutes) a week of moderate-intensity, or 75 minutes (1 hour and 15 minutes) a week of vigorous-intensity aerobic physical activity, or an equivalent combination of moderate- and vigorous-intensity aerobic activity. Aerobic activity should be performed in episodes of at least 10 minutes, and preferably, it should be spread throughout the week. Adults should also include muscle-strengthening activities that involve all major muscle groups on 2 or more days a week.   We discussed the following behavioral modification strategies today: increasing lean protein intake, decreasing simple carbohydrates, increasing vegetables, increasing water intake, decreasing eating out, no skipping meals, meal planning and cooking strategies and keeping healthy foods in the home.  She was informed of the importance of frequent follow-up visits to maximize her success with intensive lifestyle modifications for her multiple health conditions. She was informed we would discuss her lab results at her next visit unless there is a critical issue that needs to be addressed sooner. Morgan Dudley agreed to keep her next visit at the agreed upon time to discuss these results.  Objective:   Blood pressure 127/84, pulse 85, temperature 98.6 F (37 C),  height 5\' 5"  (1.651 m), weight (!) 337 lb (152.9 kg), last menstrual period 10/13/2019, SpO2 98 %. Body mass index is 56.08 kg/m.  EKG: Normal sinus rhythm, rate 92 bpm.  Indirect Calorimeter completed today shows a VO2 of 397 and a REE of 2764.  Her calculated basal metabolic rate is 2765 thus her basal metabolic rate is better than expected.  General: Cooperative, alert, well developed, in no acute distress. HEENT: Conjunctivae and lids  unremarkable. Neck: No thyromegaly.  Cardiovascular: Regular rhythm.  Lungs: Normal work of breathing. Extremities: No edema.  Neurologic: No focal deficits.   Lab Results  Component Value Date   CREATININE 0.64 10/21/2019   BUN 11 10/21/2019   NA 137 10/21/2019   K 5.1 10/21/2019   CL 101 10/21/2019   CO2 23 10/21/2019   Lab Results  Component Value Date   ALT 16 10/21/2019   AST 17 10/21/2019   ALKPHOS 86 10/21/2019   BILITOT 0.3 10/21/2019   Lab Results  Component Value Date   HGBA1C 6.2 (H) 10/21/2019   HGBA1C 5.8 (H) 09/25/2018   Lab Results  Component Value Date   INSULIN 59.7 (H) 10/21/2019   Lab Results  Component Value Date   TSH 0.495 09/25/2018   Lab Results  Component Value Date   CHOL 153 10/21/2019   HDL 55 10/21/2019   LDLCALC 77 10/21/2019   TRIG 119 10/21/2019   Lab Results  Component Value Date   WBC 9.9 11/03/2009   HGB 10.5 (L) 11/03/2009   HCT 31.8 (L) 11/03/2009   MCV 83.2 11/03/2009   PLT 238 11/03/2009   Attestation Statements:   Reviewed by clinician on day of visit: allergies, medications, problem list, medical history, surgical history, family history, social history, and previous encounter notes.  I, 11/05/2009, CMA, am acting as Insurance claims handler for Energy manager, DO.  I have reviewed the above documentation for accuracy and completeness, and I agree with the above. [employer], DO

## 2019-10-24 ENCOUNTER — Encounter (INDEPENDENT_AMBULATORY_CARE_PROVIDER_SITE_OTHER): Payer: Self-pay | Admitting: Bariatrics

## 2019-11-02 ENCOUNTER — Ambulatory Visit: Payer: Self-pay | Admitting: Skilled Nursing Facility1

## 2019-11-04 ENCOUNTER — Other Ambulatory Visit: Payer: Self-pay

## 2019-11-04 ENCOUNTER — Ambulatory Visit (INDEPENDENT_AMBULATORY_CARE_PROVIDER_SITE_OTHER): Payer: Self-pay | Admitting: Bariatrics

## 2019-11-04 VITALS — BP 127/83 | HR 96 | Temp 99.0°F | Ht 65.0 in | Wt 334.0 lb

## 2019-11-04 DIAGNOSIS — Z9189 Other specified personal risk factors, not elsewhere classified: Secondary | ICD-10-CM

## 2019-11-04 DIAGNOSIS — R7303 Prediabetes: Secondary | ICD-10-CM

## 2019-11-04 DIAGNOSIS — E559 Vitamin D deficiency, unspecified: Secondary | ICD-10-CM

## 2019-11-04 DIAGNOSIS — Z6841 Body Mass Index (BMI) 40.0 and over, adult: Secondary | ICD-10-CM

## 2019-11-04 MED ORDER — VICTOZA 18 MG/3ML ~~LOC~~ SOPN: 0.6000 mg | PEN_INJECTOR | Freq: Every day | SUBCUTANEOUS | 0 refills | Status: DC

## 2019-11-04 MED ORDER — VITAMIN D (ERGOCALCIFEROL) 1.25 MG (50000 UNIT) PO CAPS: 50000.0000 [IU] | ORAL_CAPSULE | ORAL | 0 refills | Status: DC

## 2019-11-05 ENCOUNTER — Encounter (INDEPENDENT_AMBULATORY_CARE_PROVIDER_SITE_OTHER): Payer: Self-pay | Admitting: Bariatrics

## 2019-11-05 MED FILL — VIT D2 1.25 MG (50,000 UNIT: 1.25 MG | 28 days supply | Qty: 4 | Fill #0

## 2019-11-05 MED FILL — TRI-LO-SPRINTEC TABLET: 0.18/0.215/ | 28 days supply | Qty: 28 | Fill #4

## 2019-11-05 MED FILL — !VICTOZA 18MG/3ML INJECT: 18 | 30 days supply | Qty: 3 | Fill #0

## 2019-11-05 NOTE — Progress Notes (Signed)
Chief Complaint:   OBESITY Morgan Dudley is here to discuss her progress with her obesity treatment plan along with follow-up of her obesity related diagnoses. [name] is on the Category 4 Plan + 200 calories and states she is following her eating plan approximately 85% of the time. Morgan Dudley states she is walking 12,000 steps 7 times per week.  Today's visit was #: 2 Starting weight: 337 lbs Starting date: 10/21/2019 Today's weight: 334 lbs Today's date: 11/04/2019 Total lbs lost to date: 3 Total lbs lost since last in-office visit: 3  Interim History: Morgan Dudley is down 3 lbs. She did not struggle with the plan. She is still having hunger. She is drinking more.  Subjective:   Vitamin D deficiency. [name] is not taking Vitamin D at this time. Last Vitamin D level was 20.3 on 10/21/2019. She reports decreased fatigue.  Prediabetes. Morgan Dudley has a diagnosis of prediabetes based on her elevated HgA1c and was informed this puts her at greater risk of developing diabetes. She continues to work on diet and exercise to decrease her risk of diabetes. She denies nausea or hypoglycemia. She reports increased appetite.  Lab Results  Component Value Date   HGBA1C 6.2 (H) 10/21/2019   Lab Results  Component Value Date   INSULIN 59.7 (H) 10/21/2019   At risk for diabetes mellitus. [name] is at higher than average risk for developing diabetes due to her prediabetes and obesity.   Assessment/Plan:   Vitamin D deficiency. Low Vitamin D level contributes to fatigue and are associated with obesity, breast, and colon cancer. She agrees to start prescription Vitamin D, Ergocalciferol, (DRISDOL) 1.25 MG (50000 UNIT) CAPS capsule every week #4 with 0 refills and will follow-up for routine testing of Vitamin D, at least 2-3 times per year to avoid over-replacement.    Prediabetes. Morgan Dudley will continue to work on weight loss, exercise, and decreasing simple carbohydrates to help decrease  the risk of diabetes. She was given a prescription for liraglutide (VICTOZA) 18 MG/3ML SOPN 1.8 mg (start at 0.6 mg and taper) x1 month with 0 refills.  At risk for diabetes mellitus. Morgan Dudley was given approximately 15 minutes of diabetes education and counseling today. We discussed intensive lifestyle modifications today with an emphasis on weight loss as well as increasing exercise and decreasing simple carbohydrates in her diet. We also reviewed medication options with an emphasis on risk versus benefit of those discussed.   Repetitive spaced learning was employed today to elicit superior memory formation and behavioral change.  Class 3 severe obesity with serious comorbidity and body mass index (BMI) of 50.0 to 59.9 in adult, unspecified obesity type (HCC).  Morgan Dudley is currently in the action stage of change. As such, her goal is to continue with weight loss efforts. She has agreed to the Category 4 Plan + 200 calories. Additional lunch and dinner ideas were provided.  She will work on meal planning, intentional eating, increasing protein, and decreasing carbohydrates.  Exercise goals: Morgan Dudley will continue walking 12,000 steps daily.  Behavioral modification strategies: increasing lean protein intake, decreasing simple carbohydrates, increasing vegetables, increasing water intake, keeping healthy foods in the home, ways to avoid boredom eating and planning for success.  [name] has agreed to follow-up with our clinic in 2 weeks. She was informed of the importance of frequent follow-up visits to maximize her success with intensive lifestyle modifications for her multiple health conditions.   Objective:   Blood pressure 127/83, pulse 96, temperature 99 F (37.2 C),  height 5\' 5"  (1.651 m), weight (!) 334 lb (151.5 kg), last menstrual period 10/13/2019, SpO2 98 %. Body mass index is 55.58 kg/m.  General: Cooperative, alert, well developed, in no acute distress. HEENT: Conjunctivae and  lids unremarkable. Cardiovascular: Regular rhythm.  Lungs: Normal work of breathing. Neurologic: No focal deficits.   Lab Results  Component Value Date   CREATININE 0.64 10/21/2019   BUN 11 10/21/2019   NA 137 10/21/2019   K 5.1 10/21/2019   CL 101 10/21/2019   CO2 23 10/21/2019   Lab Results  Component Value Date   ALT 16 10/21/2019   AST 17 10/21/2019   ALKPHOS 86 10/21/2019   BILITOT 0.3 10/21/2019   Lab Results  Component Value Date   HGBA1C 6.2 (H) 10/21/2019   HGBA1C 5.8 (H) 09/25/2018   Lab Results  Component Value Date   INSULIN 59.7 (H) 10/21/2019   Lab Results  Component Value Date   TSH 0.495 09/25/2018   Lab Results  Component Value Date   CHOL 153 10/21/2019   HDL 55 10/21/2019   LDLCALC 77 10/21/2019   TRIG 119 10/21/2019   Lab Results  Component Value Date   WBC 9.9 11/03/2009   HGB 10.5 (L) 11/03/2009   HCT 31.8 (L) 11/03/2009   MCV 83.2 11/03/2009   PLT 238 11/03/2009   No results found for: IRON, TIBC, FERRITIN  Attestation Statements:   Reviewed by clinician on day of visit: allergies, medications, problem list, medical history, surgical history, family history, social history, and previous encounter notes.  Migdalia Dk, am acting as Location manager for [employer], DO   I have reviewed the above documentation for accuracy and completeness, and I agree with the above. Jearld Lesch, DO

## 2019-11-19 ENCOUNTER — Other Ambulatory Visit: Payer: Self-pay

## 2019-11-19 ENCOUNTER — Encounter (INDEPENDENT_AMBULATORY_CARE_PROVIDER_SITE_OTHER): Payer: Self-pay | Admitting: Bariatrics

## 2019-11-19 ENCOUNTER — Ambulatory Visit (INDEPENDENT_AMBULATORY_CARE_PROVIDER_SITE_OTHER): Payer: Self-pay | Admitting: Bariatrics

## 2019-11-19 VITALS — BP 116/79 | HR 94 | Temp 98.5°F | Ht 65.0 in | Wt 332.0 lb

## 2019-11-19 DIAGNOSIS — E559 Vitamin D deficiency, unspecified: Secondary | ICD-10-CM

## 2019-11-19 DIAGNOSIS — Z6841 Body Mass Index (BMI) 40.0 and over, adult: Secondary | ICD-10-CM

## 2019-11-19 DIAGNOSIS — R7303 Prediabetes: Secondary | ICD-10-CM

## 2019-11-19 MED ORDER — BD PEN NEEDLE NANO 2ND GEN 32G X 4 MM MISC: 1.0000 | Freq: Two times a day (BID) | 0 refills | Status: DC

## 2019-11-19 NOTE — Progress Notes (Signed)
Chief Complaint:   Morgan Dudley is here to discuss her progress with her obesity treatment plan along with follow-up of her obesity related diagnoses. Morgan Dudley is on the Category 4 Plan + 200 calories and states she is following her eating plan approximately 60% of the time. Morgan Dudley states she is walking 8,000 steps 7 times per week.  Today's visit was #: 3 Starting weight: 337 lbs Starting date: 10/21/2019 Today's weight: 332 lbs Today's date: 11/19/2019 Total lbs lost to date: 5 Total lbs lost since last in-office visit: 2  Interim History: Morgan Dudley is down 2 lbs.  Subjective:   Vitamin D deficiency. Morgan Dudley is taking Vitamin D. Last Vitamin D level 20.3 on 10/21/2019.  Prediabetes. Morgan Dudley has a diagnosis of prediabetes based on her elevated HgA1c and was informed this puts her at greater risk of developing diabetes. She continues to work on diet and exercise to decrease her risk of diabetes. She denies nausea or hypoglycemia. Morgan Dudley was unable to take Victoza due to not having the pens.  Lab Results  Component Value Date   HGBA1C 6.2 (H) 10/21/2019   Lab Results  Component Value Date   INSULIN 59.7 (H) 10/21/2019   Assessment/Plan:   Vitamin D deficiency. Low Vitamin D level contributes to fatigue and are associated with obesity, breast, and colon cancer. She agrees to continue to take Vitamin D and will follow-up for routine testing of Vitamin D, at least 2-3 times per year to avoid over-replacement.  Prediabetes. Morgan Dudley will continue to work on weight loss, exercise, and decreasing simple carbohydrates to help decrease the risk of diabetes. She will begin Victoza. Prescription was given for Insulin Pen Needle (BD PEN NEEDLE NANO 2ND GEN) 32G X 4 MM MISC #100 with 0 refills.  Class 3 severe obesity with serious comorbidity and body mass index (BMI) of 50.0 to 59.9 in adult, unspecified obesity type (Morgan Dudley).  Morgan Dudley is currently in the action stage  of change. As such, her goal is to continue with weight loss efforts. She has agreed to the Category 4 Plan + 200 calories.  She will work on meal planning, intentional eating, will be adherent to the plan, and will increase her fiber intake.   Exercise goals: Morgan Dudley will continue walking 8,000 steps daily.  Behavioral modification strategies: increasing lean protein intake, decreasing simple carbohydrates, increasing vegetables, increasing water intake, decreasing eating out, no skipping meals, meal planning and cooking strategies, keeping healthy foods in the home and planning for success.  Morgan Dudley has agreed to follow-up with our clinic in 2 weeks. She was informed of the importance of frequent follow-up visits to maximize her success with intensive lifestyle modifications for her multiple health conditions.   Objective:   Blood pressure 116/79, pulse 94, temperature 98.5 F (36.9 C), height 5\' 5"  (1.651 m), weight (!) 332 lb (150.6 kg), last menstrual period 11/13/2019, SpO2 98 %. Body mass index is 55.25 kg/m.  General: Cooperative, alert, well developed, in no acute distress. HEENT: Conjunctivae and lids unremarkable. Cardiovascular: Regular rhythm.  Lungs: Normal work of breathing. Neurologic: No focal deficits.   Lab Results  Component Value Date   CREATININE 0.64 10/21/2019   BUN 11 10/21/2019   NA 137 10/21/2019   K 5.1 10/21/2019   CL 101 10/21/2019   CO2 23 10/21/2019   Lab Results  Component Value Date   ALT 16 10/21/2019   AST 17 10/21/2019   ALKPHOS 86 10/21/2019   BILITOT 0.3 10/21/2019  Lab Results  Component Value Date   HGBA1C 6.2 (H) 10/21/2019   HGBA1C 5.8 (H) 09/25/2018   Lab Results  Component Value Date   INSULIN 59.7 (H) 10/21/2019   Lab Results  Component Value Date   TSH 0.495 09/25/2018   Lab Results  Component Value Date   CHOL 153 10/21/2019   HDL 55 10/21/2019   LDLCALC 77 10/21/2019   TRIG 119 10/21/2019   Lab Results    Component Value Date   WBC 9.9 11/03/2009   HGB 10.5 (L) 11/03/2009   HCT 31.8 (L) 11/03/2009   MCV 83.2 11/03/2009   PLT 238 11/03/2009    Attestation Statements:   Reviewed by clinician on day of visit: allergies, medications, problem list, medical history, surgical history, family history, social history, and previous encounter notes.  Fernanda Drum, am acting as Energy manager for [employer], DO   I have reviewed the above documentation for accuracy and completeness, and I agree with the above. Corinna Capra, DO

## 2019-11-20 ENCOUNTER — Other Ambulatory Visit: Payer: Self-pay | Admitting: Pharmacist

## 2019-11-20 DIAGNOSIS — R7303 Prediabetes: Secondary | ICD-10-CM

## 2019-11-20 MED ORDER — BD PEN NEEDLE NANO 2ND GEN 32G X 4 MM MISC: 1.0000 | Freq: Two times a day (BID) | 6 refills | Status: DC

## 2019-11-20 MED FILL — TRUEPLUS PEN NDL 32GX5/32": 32G X 4 MM | 50 days supply | Qty: 100 | Fill #0

## 2019-11-20 MED FILL — TRUEPLUS PEN NDL 32GX5/32: 32G X 4 MM | 50 days supply | Qty: 100 | Fill #0

## 2019-11-23 ENCOUNTER — Ambulatory Visit: Payer: Self-pay | Admitting: Skilled Nursing Facility1

## 2019-11-23 ENCOUNTER — Encounter (INDEPENDENT_AMBULATORY_CARE_PROVIDER_SITE_OTHER): Payer: Self-pay | Admitting: Bariatrics

## 2019-11-26 ENCOUNTER — Ambulatory Visit (INDEPENDENT_AMBULATORY_CARE_PROVIDER_SITE_OTHER): Payer: Self-pay | Admitting: Internal Medicine

## 2019-11-26 DIAGNOSIS — E559 Vitamin D deficiency, unspecified: Secondary | ICD-10-CM

## 2019-11-26 DIAGNOSIS — R7303 Prediabetes: Secondary | ICD-10-CM

## 2019-11-26 DIAGNOSIS — Z7689 Persons encountering health services in other specified circumstances: Secondary | ICD-10-CM

## 2019-11-26 NOTE — Progress Notes (Signed)
Virtual Visit via Telephone Note  I connected with Doree Barthel, on 11/26/2019 at 1:33 PM by telephone due to the COVID-19 pandemic and verified that I am speaking with the correct person using two identifiers.   Consent: I discussed the limitations, risks, security and privacy concerns of performing an evaluation and management service by telephone and the availability of in person appointments. I also discussed with the patient that there may be a patient responsible charge related to this service. The patient expressed understanding and agreed to proceed.   Location of Patient: Home   Location of Provider: Home    Persons participating in Telemedicine visit: Laterrica Libman [facility] Dr. Juleen China      History of Present Illness: Patient has a visit to establish care.   Has a PMH of morbid obesity and pre-diabetes. Followed by [facility]. Last A1c was 6.2% one month ago. Due to increased insulin levels, was started on Victoza 0.6 mg. Plan to try to increase the dose.   She is concerned about her weight. She has been walking and trying to do everything she can do for her weight. Does have trouble with what she is eating.   Does take OCPs. She is not a smoker.   Currently is taking 4 week 50000 IU supplements for Vit D deficiency. Asks if she will need to continue this once finished.  Past Medical History:  Diagnosis Date  . GERD (gastroesophageal reflux disease)   . Obesity   . Pre-diabetes    No Known Allergies  Current Outpatient Medications on File Prior to Visit  Medication Sig Dispense Refill  . Insulin Pen Needle (BD PEN NEEDLE NANO 2ND GEN) 32G X 4 MM MISC 1 Package by Does not apply route 2 (two) times daily. 100 each 6  . liraglutide (VICTOZA) 18 MG/3ML SOPN Inject 0.1 mLs (0.6 mg total) into the skin daily. 1 pen 0  . Norgestimate-Ethinyl Estradiol Triphasic (ORTHO TRI-CYCLEN LO) 0.18/0.215/0.25 MG-25 MCG tab Take 1  tablet by mouth daily. 1 Package 4  . Vitamin D, Ergocalciferol, (DRISDOL) 1.25 MG (50000 UNIT) CAPS capsule Take 1 capsule (50,000 Units total) by mouth every 7 (seven) days. 4 capsule 0   No current facility-administered medications on file prior to visit.    Observations/Objective: NAD. Speaking clearly.  Work of breathing normal.  Alert and oriented. Mood appropriate.   Assessment and Plan: 1. Encounter to establish care   2. Morbid obesity ([place]) [facility] is [facility]. Congratulated her on her weight loss and motivation to take control of her health. Discussed that Victoza might help with weight loss. I do not routinely prescribe medications that are specifically for losing weight. I believe she is being followed by the best physicians for this goal; reinforced that. Briefly discussed options for bariatric surgery in the future.   3. Vitamin D insufficiency Discussed that with level of 20, 4 weeks of high dose Vit D supplementation will likely suffice. She has follow up with Dr. Owens Shark at [facility]. Recommended asking about if intend to recheck level and if would recommend a 600 or 800 IU OTC supplement in the future.   4. Prediabetes A1c 6.2%. Continue Victoza.    Follow Up Instructions: For annual exam    I discussed the assessment and treatment plan with the patient. The patient was provided an opportunity to ask questions and all were answered. The patient agreed with the plan and  demonstrated an understanding of the instructions.   The patient was advised to call back or seek an in-person evaluation if the symptoms worsen or if the condition fails to improve as anticipated.     I provided 14 minutes total of non-face-to-face time during this encounter including median intraservice time, reviewing previous notes, investigations, ordering medications, medical decision making, coordinating care and patient verbalized  understanding at the end of the visit.    Marcy Siren, D.O. Primary Care at [facility]  11/26/2019, 1:33 PM

## 2019-11-26 NOTE — Patient Instructions (Signed)
Thank you for choosing Primary Care at [facility] to be your medical home!    Sallyanne Havers was seen by De Hollingshead, DO today.   Deedra Ehrich Worthy's primary care provider is Marcy Siren, DO.Marland Kitchen   For the best care possible, you should try to see Marcy Siren, DO whenever you come to the clinic.   We look forward to seeing you again soon!  If you have any questions about your visit today, please call us at [phone] or feel free to reach your primary care provider via MyChart.

## 2019-11-27 ENCOUNTER — Other Ambulatory Visit: Payer: Self-pay

## 2019-11-27 ENCOUNTER — Ambulatory Visit: Payer: Self-pay | Attending: Family Medicine

## 2019-12-03 ENCOUNTER — Ambulatory Visit (INDEPENDENT_AMBULATORY_CARE_PROVIDER_SITE_OTHER): Payer: Self-pay | Admitting: Bariatrics

## 2019-12-08 ENCOUNTER — Other Ambulatory Visit: Payer: Self-pay | Admitting: Internal Medicine

## 2019-12-08 ENCOUNTER — Ambulatory Visit (INDEPENDENT_AMBULATORY_CARE_PROVIDER_SITE_OTHER): Payer: Self-pay | Admitting: Bariatrics

## 2019-12-08 ENCOUNTER — Encounter (INDEPENDENT_AMBULATORY_CARE_PROVIDER_SITE_OTHER): Payer: Self-pay | Admitting: Bariatrics

## 2019-12-08 ENCOUNTER — Other Ambulatory Visit: Payer: Self-pay

## 2019-12-08 VITALS — BP 124/85 | HR 89 | Temp 99.0°F | Ht 65.0 in | Wt 333.0 lb

## 2019-12-08 DIAGNOSIS — E559 Vitamin D deficiency, unspecified: Secondary | ICD-10-CM

## 2019-12-08 DIAGNOSIS — Z6841 Body Mass Index (BMI) 40.0 and over, adult: Secondary | ICD-10-CM

## 2019-12-08 DIAGNOSIS — R7303 Prediabetes: Secondary | ICD-10-CM

## 2019-12-08 DIAGNOSIS — Z9189 Other specified personal risk factors, not elsewhere classified: Secondary | ICD-10-CM

## 2019-12-08 MED ORDER — VICTOZA 18 MG/3ML ~~LOC~~ SOPN: 1.2000 mg | PEN_INJECTOR | Freq: Every day | SUBCUTANEOUS | 0 refills | Status: DC

## 2019-12-08 MED ORDER — VITAMIN D (ERGOCALCIFEROL) 1.25 MG (50000 UNIT) PO CAPS: 50000.0000 [IU] | ORAL_CAPSULE | ORAL | 0 refills | Status: DC

## 2019-12-08 MED FILL — VIT D2 1.25 MG (50,000 UNIT: 1.25 MG | 28 days supply | Qty: 4 | Fill #0

## 2019-12-08 MED FILL — !VICTOZA 18MG/3ML INJECT: 18 | 30 days supply | Qty: 6 | Fill #0

## 2019-12-08 NOTE — Progress Notes (Signed)
Chief Complaint:   Morgan Dudley is here to discuss her progress with her obesity treatment plan along with follow-up of her obesity related diagnoses. Morgan Dudley is on the Category 4 Plan + 200 calories and states she is following her eating plan approximately 60-70% of the time. Morgan Dudley states she is walking 10,000 to 12,000 steps 7 times per week.  Today's visit was #: 4 Starting weight: 337 lbs Starting date: 10/21/2019 Today's weight: 333 lbs Today's date: 12/08/2019 Total lbs lost to date: 4 Total lbs lost since last in-office visit: 0  Interim History: Morgan Dudley is up 1 lb. She has a hard time in the evening with her appetite.  Subjective:   Vitamin D deficiency. No nausea, vomiting, or muscle weakness. Last Vitamin D 20.3 on 10/21/2019.  Prediabetes. Morgan Dudley has a diagnosis of prediabetes based on her elevated HgA1c and was informed this puts her at greater risk of developing diabetes. She continues to work on diet and exercise to decrease her risk of diabetes. She denies nausea or hypoglycemia. Morgan Dudley is taking Victoza with no side effects.  Lab Results  Component Value Date   HGBA1C 6.2 (H) 10/21/2019   Lab Results  Component Value Date   INSULIN 59.7 (H) 10/21/2019   At risk for diabetes mellitus. Morgan Dudley is at higher than average risk for developing diabetes due to her prediabetes and obesity.   Assessment/Plan:   Vitamin D deficiency. Low Vitamin D level contributes to fatigue and are associated with obesity, breast, and colon cancer. She was given a prescription for Vitamin D, Ergocalciferol, (DRISDOL) 1.25 MG (50000 UNIT) CAPS capsule every week #4 with 0 refills and will follow-up for routine testing of Vitamin D, at least 2-3 times per year to avoid over-replacement.   Prediabetes. Morgan Dudley will continue to work on weight loss, exercise, and decreasing simple carbohydrates to help decrease the risk of diabetes. She will continue Victoza and was  given a prescription for an increased dose of liraglutide (VICTOZA) 18 MG/3ML SOPN 1.2 mg daily, 1 month supply with 0 refills.  At risk for diabetes mellitus. Morgan Dudley was given approximately 15 minutes of diabetes education and counseling today. We discussed intensive lifestyle modifications today with an emphasis on weight loss as well as increasing exercise and decreasing simple carbohydrates in her diet. We also reviewed medication options with an emphasis on risk versus benefit of those discussed.   Repetitive spaced learning was employed today to elicit superior memory formation and behavioral change.  Class 3 severe obesity with serious comorbidity and body mass index (BMI) of 50.0 to 59.9 in adult, unspecified obesity type (Morgan Dudley).  Morgan Dudley is currently in the action stage of change. As such, her goal is to continue with weight loss efforts. She has agreed to the Category 4 Plan + 200 calories.   She will work on meal planning, intentional eating, and increasing fiber and more salad.  Exercise goals: Morgan Dudley will continue walking 10,000 to 12,000 steps daily.   Behavioral modification strategies: increasing lean protein intake, decreasing simple carbohydrates, increasing vegetables, increasing water intake, decreasing eating out, no skipping meals, meal planning and cooking strategies, keeping healthy foods in the home and planning for success.  Morgan Dudley has agreed to follow-up with our clinic in 2 weeks. She was informed of the importance of frequent follow-up visits to maximize her success with intensive lifestyle modifications for her multiple health conditions.   Objective:   Blood pressure 124/85, pulse 89, temperature 99 F (37.2 C),  height 5\' 5"  (1.651 m), weight (!) 333 lb (151 kg), last menstrual period 12/07/2019, SpO2 98 %. Body mass index is 55.41 kg/m.  General: Cooperative, alert, well developed, in no acute distress. HEENT: Conjunctivae and lids  unremarkable. Cardiovascular: Regular rhythm.  Lungs: Normal work of breathing. Neurologic: No focal deficits.   Lab Results  Component Value Date   CREATININE 0.64 10/21/2019   BUN 11 10/21/2019   NA 137 10/21/2019   K 5.1 10/21/2019   CL 101 10/21/2019   CO2 23 10/21/2019   Lab Results  Component Value Date   ALT 16 10/21/2019   AST 17 10/21/2019   ALKPHOS 86 10/21/2019   BILITOT 0.3 10/21/2019   Lab Results  Component Value Date   HGBA1C 6.2 (H) 10/21/2019   HGBA1C 5.8 (H) 09/25/2018   Lab Results  Component Value Date   INSULIN 59.7 (H) 10/21/2019   Lab Results  Component Value Date   TSH 0.495 09/25/2018   Lab Results  Component Value Date   CHOL 153 10/21/2019   HDL 55 10/21/2019   LDLCALC 77 10/21/2019   TRIG 119 10/21/2019   Lab Results  Component Value Date   WBC 9.9 11/03/2009   HGB 10.5 (L) 11/03/2009   HCT 31.8 (L) 11/03/2009   MCV 83.2 11/03/2009   PLT 238 11/03/2009   No results found for: IRON, TIBC, FERRITIN  Attestation Statements:   Reviewed by clinician on day of visit: allergies, medications, problem list, medical history, surgical history, family history, social history, and previous encounter notes.  11/05/2009, am acting as Fernanda Drum for [employer] manager, DO   I have reviewed the above documentation for accuracy and completeness, and I agree with the above. [employer], DO

## 2019-12-09 MED FILL — TRI-LO-ESTARYLLA 0.18/0.215: 0.18/0.215/ | 28 days supply | Qty: 28 | Fill #0

## 2019-12-29 ENCOUNTER — Encounter (INDEPENDENT_AMBULATORY_CARE_PROVIDER_SITE_OTHER): Payer: Self-pay | Admitting: Bariatrics

## 2019-12-29 ENCOUNTER — Other Ambulatory Visit: Payer: Self-pay

## 2019-12-29 ENCOUNTER — Ambulatory Visit (INDEPENDENT_AMBULATORY_CARE_PROVIDER_SITE_OTHER): Payer: Self-pay | Admitting: Bariatrics

## 2019-12-29 VITALS — BP 127/82 | HR 95 | Temp 98.3°F | Ht 65.0 in | Wt 333.0 lb

## 2019-12-29 DIAGNOSIS — F3289 Other specified depressive episodes: Secondary | ICD-10-CM

## 2019-12-29 DIAGNOSIS — R7303 Prediabetes: Secondary | ICD-10-CM

## 2019-12-29 DIAGNOSIS — Z6841 Body Mass Index (BMI) 40.0 and over, adult: Secondary | ICD-10-CM

## 2019-12-29 DIAGNOSIS — E559 Vitamin D deficiency, unspecified: Secondary | ICD-10-CM

## 2019-12-29 DIAGNOSIS — Z9189 Other specified personal risk factors, not elsewhere classified: Secondary | ICD-10-CM

## 2019-12-29 MED ORDER — VITAMIN D (ERGOCALCIFEROL) 1.25 MG (50000 UNIT) PO CAPS: 50000.0000 [IU] | ORAL_CAPSULE | ORAL | 0 refills | Status: DC

## 2019-12-29 MED ORDER — VICTOZA 18 MG/3ML ~~LOC~~ SOPN: 1.8000 mg | PEN_INJECTOR | Freq: Every day | SUBCUTANEOUS | 0 refills | Status: DC

## 2019-12-29 MED ORDER — TROKENDI XR 50 MG PO CP24: 50.0000 mg | ORAL_CAPSULE | Freq: Every day | ORAL | 0 refills | Status: DC

## 2019-12-29 NOTE — Progress Notes (Signed)
Chief Complaint:   OBESITY Morgan Dudley is here to discuss her progress with her obesity treatment plan along with follow-up of her obesity related diagnoses. Morgan Dudley is on the Category 4 Plan and states she is following her eating plan approximately 60% of the time. Morgan Dudley states she is exercising 0 minutes 0 times per week.  Today's visit was #: 5 Starting weight: 337 lbs Starting date: 10/21/2019 Today's weight: 333 lbs Today's date: 12/29/2019 Total lbs lost to date: 4 Total lbs lost since last in-office visit: 0  Interim History: Morgan Dudley's weight has stayed the same since her last visit. She states that she hurt her leg and hand and has not been exercising.  Subjective:   Prediabetes. Morgan Dudley has a diagnosis of prediabetes based on her elevated HgA1c and was informed this puts her at greater risk of developing diabetes. She continues to work on diet and exercise to decrease her risk of diabetes. She denies nausea or hypoglycemia. Morgan Dudley is taking Victoza.  Lab Results  Component Value Date   HGBA1C 6.2 (H) 10/21/2019   Lab Results  Component Value Date   INSULIN 59.7 (H) 10/21/2019   Vitamin D deficiency. No nausea, vomiting, or muscle weakness. Last Vitamin D 20.3 on 10/21/2019.  Other depression, with emotional eating. Morgan Dudley is struggling with emotional eating and using food for comfort to the extent that it is negatively impacting her health. She has been working on behavior modification techniques to help reduce her emotional eating and has been somewhat successful. She shows no sign of suicidal or homicidal ideations. Morgan Dudley reports increased emotional eating and increased appetite in the afternoon.  At risk for osteoporosis. Morgan Dudley is at higher risk of osteopenia and osteoporosis due to Vitamin D deficiency.   Assessment/Plan:   Prediabetes. Morgan Dudley will continue to work on weight loss, exercise, and decreasing simple carbohydrates to help  decrease the risk of diabetes. She will increase her dose of Victoza to 1.8 mg daily and was given a prescription for liraglutide (VICTOZA) 18 MG/3ML SOPN 1.8 mg SQ daily, 1 month supply, with 0 refills.  Vitamin D deficiency. Low Vitamin D level contributes to fatigue and are associated with obesity, breast, and colon cancer. She was given a prescription for Vitamin D, Ergocalciferol, (DRISDOL) 1.25 MG (50000 UNIT) CAPS capsule every week #4 with 0 refills and will follow-up for routine testing of Vitamin D, at least 2-3 times per year to avoid over-replacement.    Other depression, with emotional eating. Behavior modification techniques were discussed today to help Morgan Dudley deal with her emotional/non-hunger eating behaviors.  Orders and follow up as documented in patient record. Morgan Dudley was given a prescription for Topiramate ER (TROKENDI XR) 50 MG CP24 1 PO daily #30 with 0 refills.  At risk for osteoporosis. Morgan Dudley was given approximately 15 minutes of osteoporosis prevention counseling today. Morgan Dudley is at risk for osteopenia and osteoporosis due to her Vitamin D deficiency. She was encouraged to take her Vitamin D and follow her higher calcium diet and increase strengthening exercise to help strengthen her bones and decrease her risk of osteopenia and osteoporosis.  Repetitive spaced learning was employed today to elicit superior memory formation and behavioral change.  Class 3 severe obesity with serious comorbidity and body mass index (BMI) of 50.0 to 59.9 in adult, unspecified obesity type (HCC).  Morgan Dudley is currently in the action stage of change. As such, her goal is to continue with weight loss efforts. She has agreed to the Category  4 Plan.   She will work on meal planning and will use Meta Fiber once daily with water.  Exercise goals: All adults should avoid inactivity. Some physical activity is better than none, and adults who participate in any amount of physical activity gain  some health benefits.  Behavioral modification strategies: increasing lean protein intake, decreasing simple carbohydrates, increasing vegetables, increasing water intake, decreasing eating out, no skipping meals, meal planning and cooking strategies, keeping healthy foods in the home and planning for success.  Morgan Dudley has agreed to follow-up with our clinic in 2 weeks. She was informed of the importance of frequent follow-up visits to maximize her success with intensive lifestyle modifications for her multiple health conditions.   Objective:   Blood pressure 127/82, pulse 95, temperature 98.3 F (36.8 C), height 5\' 5"  (1.651 m), weight (!) 333 lb (151 kg), last menstrual period 12/07/2019, SpO2 98 %. Body mass index is 55.41 kg/m.  General: Cooperative, alert, well developed, in no acute distress. HEENT: Conjunctivae and lids unremarkable. Cardiovascular: Regular rhythm.  Lungs: Normal work of breathing. Neurologic: No focal deficits. Pt has brace on right wrist.  Lab Results  Component Value Date   CREATININE 0.64 10/21/2019   BUN 11 10/21/2019   NA 137 10/21/2019   K 5.1 10/21/2019   CL 101 10/21/2019   CO2 23 10/21/2019   Lab Results  Component Value Date   ALT 16 10/21/2019   AST 17 10/21/2019   ALKPHOS 86 10/21/2019   BILITOT 0.3 10/21/2019   Lab Results  Component Value Date   HGBA1C 6.2 (H) 10/21/2019   HGBA1C 5.8 (H) 09/25/2018   Lab Results  Component Value Date   INSULIN 59.7 (H) 10/21/2019   Lab Results  Component Value Date   TSH 0.495 09/25/2018   Lab Results  Component Value Date   CHOL 153 10/21/2019   HDL 55 10/21/2019   LDLCALC 77 10/21/2019   TRIG 119 10/21/2019   Lab Results  Component Value Date   WBC 9.9 11/03/2009   HGB 10.5 (L) 11/03/2009   HCT 31.8 (L) 11/03/2009   MCV 83.2 11/03/2009   PLT 238 11/03/2009   No results found for: IRON, TIBC, FERRITIN  Attestation Statements:   Reviewed by clinician on day of visit: allergies,  medications, problem list, medical history, surgical history, family history, social history, and previous encounter notes.  Migdalia Dk, am acting as Location manager for [employer], DO   I have reviewed the above documentation for accuracy and completeness, and I agree with the above. Jearld Lesch, DO

## 2019-12-31 MED FILL — TRI-LO-ESTARYLLA 0.18/0.215: 0.18/0.215/ | 28 days supply | Qty: 28 | Fill #1

## 2020-01-04 MED FILL — VIT D2 1.25 MG (50,000 UNIT: 1.25 MG | 28 days supply | Qty: 4 | Fill #0

## 2020-01-04 MED FILL — !VICTOZA 18MG/3ML INJECT: 18 | 30 days supply | Qty: 9 | Fill #0

## 2020-01-12 ENCOUNTER — Ambulatory Visit (INDEPENDENT_AMBULATORY_CARE_PROVIDER_SITE_OTHER): Payer: Self-pay | Admitting: Bariatrics

## 2020-01-12 ENCOUNTER — Encounter (INDEPENDENT_AMBULATORY_CARE_PROVIDER_SITE_OTHER): Payer: Self-pay | Admitting: Bariatrics

## 2020-01-12 ENCOUNTER — Other Ambulatory Visit: Payer: Self-pay

## 2020-01-12 VITALS — BP 131/85 | HR 98 | Temp 98.3°F | Ht 65.0 in | Wt 330.0 lb

## 2020-01-12 DIAGNOSIS — E559 Vitamin D deficiency, unspecified: Secondary | ICD-10-CM

## 2020-01-12 DIAGNOSIS — Z9189 Other specified personal risk factors, not elsewhere classified: Secondary | ICD-10-CM

## 2020-01-12 DIAGNOSIS — R7303 Prediabetes: Secondary | ICD-10-CM

## 2020-01-12 DIAGNOSIS — Z6841 Body Mass Index (BMI) 40.0 and over, adult: Secondary | ICD-10-CM

## 2020-01-12 DIAGNOSIS — F3289 Other specified depressive episodes: Secondary | ICD-10-CM

## 2020-01-12 MED ORDER — BD PEN NEEDLE NANO 2ND GEN 32G X 4 MM MISC: 1.0000 | Freq: Two times a day (BID) | 6 refills | Status: DC

## 2020-01-12 MED ORDER — VICTOZA 18 MG/3ML ~~LOC~~ SOPN: 1.8000 mg | PEN_INJECTOR | Freq: Every day | SUBCUTANEOUS | 0 refills | Status: DC

## 2020-01-12 MED FILL — TRUEPLUS PEN NDL 32GX5/32: 32G X 4 MM | 50 days supply | Qty: 100 | Fill #0

## 2020-01-12 NOTE — Progress Notes (Signed)
Chief Complaint:   Morgan Dudley is here to discuss her progress with her obesity treatment plan along with follow-up of her obesity related diagnoses. Morgan Dudley is on the Category 4 Plan and states she is following her eating plan approximately 70% of the time. Morgan Dudley states she is walking 7,000 steps 7 times per week.  Today's visit was #: 6 Starting weight: 337 lbs Starting date: 10/21/2019 Today's weight: 330 lbs Today's date: 01/12/2020 Total lbs lost to date: 7 Total lbs lost since last in-office visit: 3  Interim History: Morgan Dudley is down 3 lbs and doing well overall.  Subjective:   Prediabetes. Morgan Dudley has a diagnosis of prediabetes based on her elevated HgA1c and was informed this puts her at greater risk of developing diabetes. She continues to work on diet and exercise to decrease her risk of diabetes. She denies nausea or hypoglycemia. No polyphagia. Morgan Dudley is taking Victoza.  Lab Results  Component Value Date   HGBA1C 6.2 (H) 10/21/2019   Lab Results  Component Value Date   INSULIN 59.7 (H) 10/21/2019   Vitamin D deficiency. Morgan Dudley is taking Vitamin D. No nausea, vomiting, or muscle weakness. Last Vitamin D 20.3 on 10/21/2019.  Other depression, with emotional eating. Morgan Dudley is struggling with emotional eating and using food for comfort to the extent that it is negatively impacting her health. She has been working on behavior modification techniques to help reduce her emotional eating and has been somewhat successful. She shows no sign of suicidal or homicidal ideations. Morgan Dudley endorses emotional eating. She did not begin the Trokendi due to problems with the pharmacy.  At risk for hypoglycemia. Morgan Dudley is at increased risk for hypoglycemia due to prediabetes and dietary changes.   Assessment/Plan:   Prediabetes. Morgan Dudley will continue to work on weight loss, exercise, and decreasing simple carbohydrates to help decrease the risk of  diabetes.  She was given a prescription for Insulin Pen Needle (BD PEN NEEDLE NANO 2ND GEN) 32G X 4 MM MISC #100 with 0 refills and a prescription for liraglutide (VICTOZA) 18 MG/3ML SOPN 0.3 mL (1.8 mg total) into skin, 1 month supply with 0 refills.  Vitamin D deficiency. Low Vitamin D level contributes to fatigue and are associated with obesity, breast, and colon cancer. She agrees to continue to take Vitamin D and will follow-up for routine testing of Vitamin D, at least 2-3 times per year to avoid over-replacement.  Other depression, with emotional eating. Behavior modification techniques were discussed today to help Morgan Dudley deal with her emotional/non-hunger eating behaviors.  Orders and follow up as documented in patient record. She will begin Trokendi for emotional eating.  At risk for hypoglycemia. Morgan Dudley was given approximately 15 minutes of counseling today regarding prevention of hypoglycemia. She was advised of symptoms of hypoglycemia. Morgan Dudley was instructed to avoid skipping meals, eat regular protein rich meals and schedule low calorie snacks as needed.   Repetitive spaced learning was employed today to elicit superior memory formation and behavioral change.  Class 3 severe obesity with serious comorbidity and body mass index (BMI) of 50.0 to 59.9 in adult, unspecified obesity type (Morgan Dudley).  Morgan Dudley is currently in the action stage of change. As such, her goal is to continue with weight loss efforts. She has agreed to the Category 4 Plan and will journal 350-450 calories and 30+ grams of protein at breakfast.  She will work on meal planning, intentional eating, and keep increasing her water intake. She was given calorie and  protein goals for each meal.   Exercise goals: All adults should avoid inactivity. Some physical activity is better than none, and adults who participate in any amount of physical activity gain some health benefits.  Behavioral modification strategies:  increasing lean protein intake, decreasing simple carbohydrates, increasing vegetables, increasing water intake, decreasing eating out, no skipping meals, meal planning and cooking strategies, keeping healthy foods in the home and planning for success.  Morgan Dudley has agreed to follow-up with our clinic in 2 weeks. She was informed of the importance of frequent follow-up visits to maximize her success with intensive lifestyle modifications for her multiple health conditions.   Objective:   Blood pressure 131/85, pulse 98, temperature 98.3 F (36.8 C), height 5\' 5"  (1.651 m), weight (!) 330 lb (149.7 kg), last menstrual period 01/12/2020, SpO2 100 %. Body mass index is 54.91 kg/m.  General: Cooperative, alert, well developed, in no acute distress. HEENT: Conjunctivae and lids unremarkable. Cardiovascular: Regular rhythm.  Lungs: Normal work of breathing. Neurologic: No focal deficits.   Lab Results  Component Value Date   CREATININE 0.64 10/21/2019   BUN 11 10/21/2019   NA 137 10/21/2019   K 5.1 10/21/2019   CL 101 10/21/2019   CO2 23 10/21/2019   Lab Results  Component Value Date   ALT 16 10/21/2019   AST 17 10/21/2019   ALKPHOS 86 10/21/2019   BILITOT 0.3 10/21/2019   Lab Results  Component Value Date   HGBA1C 6.2 (H) 10/21/2019   HGBA1C 5.8 (H) 09/25/2018   Lab Results  Component Value Date   INSULIN 59.7 (H) 10/21/2019   Lab Results  Component Value Date   TSH 0.495 09/25/2018   Lab Results  Component Value Date   CHOL 153 10/21/2019   HDL 55 10/21/2019   LDLCALC 77 10/21/2019   TRIG 119 10/21/2019   Lab Results  Component Value Date   WBC 9.9 11/03/2009   HGB 10.5 (L) 11/03/2009   HCT 31.8 (L) 11/03/2009   MCV 83.2 11/03/2009   PLT 238 11/03/2009   No results found for: IRON, TIBC, FERRITIN  Attestation Statements:   Reviewed by clinician on day of visit: allergies, medications, problem list, medical history, surgical history, family history, social  history, and previous encounter notes.  11/05/2009, am acting as Fernanda [employer] for Energy manager, DO   I have reviewed the above documentation for accuracy and completeness, and I agree with the above. [employer], DO

## 2020-01-15 MED FILL — TOPIRAMATE 50 MG TABLET: 50 | 30 days supply | Qty: 30 | Fill #0

## 2020-01-28 ENCOUNTER — Encounter (INDEPENDENT_AMBULATORY_CARE_PROVIDER_SITE_OTHER): Payer: Self-pay | Admitting: Bariatrics

## 2020-01-28 ENCOUNTER — Other Ambulatory Visit: Payer: Self-pay

## 2020-01-28 ENCOUNTER — Ambulatory Visit (INDEPENDENT_AMBULATORY_CARE_PROVIDER_SITE_OTHER): Payer: Self-pay | Admitting: Bariatrics

## 2020-01-28 VITALS — BP 119/81 | HR 103 | Temp 98.6°F | Ht 65.0 in | Wt 328.0 lb

## 2020-01-28 DIAGNOSIS — R7303 Prediabetes: Secondary | ICD-10-CM

## 2020-01-28 DIAGNOSIS — Z6841 Body Mass Index (BMI) 40.0 and over, adult: Secondary | ICD-10-CM

## 2020-01-28 DIAGNOSIS — Z9189 Other specified personal risk factors, not elsewhere classified: Secondary | ICD-10-CM

## 2020-01-28 DIAGNOSIS — E559 Vitamin D deficiency, unspecified: Secondary | ICD-10-CM

## 2020-01-28 DIAGNOSIS — Z789 Other specified health status: Secondary | ICD-10-CM

## 2020-01-28 MED ORDER — VITAMIN D (ERGOCALCIFEROL) 1.25 MG (50000 UNIT) PO CAPS: 50000.0000 [IU] | ORAL_CAPSULE | ORAL | 0 refills | Status: DC

## 2020-01-28 MED ORDER — VICTOZA 18 MG/3ML ~~LOC~~ SOPN: 1.8000 mg | PEN_INJECTOR | Freq: Every day | SUBCUTANEOUS | 0 refills | Status: DC

## 2020-01-28 MED ORDER — NORGESTIM-ETH ESTRAD TRIPHASIC 0.18/0.215/0.25 MG-25 MCG PO TABS: 1.0000 | ORAL_TABLET | Freq: Every day | ORAL | 2 refills | Status: DC

## 2020-01-28 MED FILL — ?ERGOCALCIFEROL 50000 UNITC: 1.25 MG | 4 days supply | Qty: 4 | Fill #0

## 2020-01-28 MED FILL — TRI-LO-ESTARYLLA 0.18/0.215: 0.18/0.215/ | 28 days supply | Qty: 28 | Fill #0

## 2020-01-28 NOTE — Progress Notes (Signed)
Chief Complaint:   OBESITY Morgan Dudley is here to discuss her progress with her obesity treatment plan along with follow-up of her obesity related diagnoses. Morgan Dudley is on the Category 4 Plan and journaling 350-450 calories and 30 grams of protein at breakfast and states she is following her eating plan approximately 75-80% of the time. Morgan Dudley states she is walking 8,000-9,000 steps 5-6 times per week.  Today's visit was #: 7 Starting weight: 337 lbs Starting date: 10/21/2019 Today's weight: 328 lbs Today's date: 01/28/2020 Total lbs lost to date: 9 Total lbs lost since last in-office visit: 2  Interim History: Morgan Dudley is down 2 lbs and has been doing well on the plan. She is still having some cravings, but says it is getting better. She reports adequate water intake and increased activity. She denies skipping meals.  Subjective:   Vitamin D deficiency. Morgan Dudley is taking Vitamin D. Last Vitamin D 20.3 on 10/21/2019.  Prediabetes. Morgan Dudley has a diagnosis of prediabetes based on her elevated HgA1c and was informed this puts her at greater risk of developing diabetes. She continues to work on diet and exercise to decrease her risk of diabetes. She denies nausea or hypoglycemia. Morgan Dudley is taking Victoza 1.8 mg daily. She doesn't feel it is helping as much as it used to.  Lab Results  Component Value Date   HGBA1C 6.2 (H) 10/21/2019   Lab Results  Component Value Date   INSULIN 59.7 (H) 10/21/2019   Uses birth control. Morgan Dudley is taking Tri-Lo-Sprintec.  At risk for hyperglycemia. Morgan Dudley is at risk for hyperglycemia due to prediabetes.  Assessment/Plan:   Vitamin D deficiency. Low Vitamin D level contributes to fatigue and are associated with obesity, breast, and colon cancer. She was given a refill on her Vitamin D, Ergocalciferol, (DRISDOL) 1.25 MG (50000 UNIT) CAPS capsule every week #4 with 0 refills and will follow-up for routine testing of Vitamin D, at  least 2-3 times per year to avoid over-replacement.   Prediabetes. Morgan Dudley will continue to work on weight loss, exercise, and decreasing simple carbohydrates to help decrease the risk of diabetes. Refill was given for liraglutide (VICTOZA) 18 MG/3ML SOPN inject 1.8 mg into skin daily.  Uses birth control. Refill was given for Norgestimate-Ethinyl Estradiol Triphasic (TRI-LO-SPRINTEC) 0.18/0.215/0.25 MG-25 MCG tab 1 tablet daily #1 with 2 refills.  At risk for hyperglycemia. Morgan Dudley was given approximately 15 minutes of counseling today regarding prevention of hyperglycemia. She was advised of hyperglycemia causes and the fact hyperglycemia is often asymptomatic. Morgan Dudley was instructed to avoid skipping meals, eat regular protein rich meals and schedule low calorie but protein rich snacks as needed.   Repetitive spaced learning was employed today to elicit superior memory formation and behavioral change.  Class 3 severe obesity with serious comorbidity and body mass index (BMI) of 50.0 to 59.9 in adult, unspecified obesity type (HCC).  Morgan Dudley is currently in the action stage of change. As such, her goal is to continue with weight loss efforts. She has agreed to the Category 4 Plan and will journal 350-450 calories + 30 grams of protein at breakfast.   She will work on meal planning.  Exercise goals: All adults should avoid inactivity. Some physical activity is better than none, and adults who participate in any amount of physical activity gain some health benefits.  Behavioral modification strategies: increasing lean protein intake, decreasing simple carbohydrates, increasing vegetables, increasing water intake, decreasing eating out, no skipping meals, meal planning and cooking strategies,  keeping healthy foods in the home and planning for success.  Morgan Dudley has agreed to follow-up with our clinic in 2 weeks. She was informed of the importance of frequent follow-up visits to maximize her  success with intensive lifestyle modifications for her multiple health conditions.   Objective:   Blood pressure 119/81, pulse (!) 103, temperature 98.6 F (37 C), height 5\' 5"  (1.651 m), weight (!) 328 lb (148.8 kg), last menstrual period 01/12/2020, SpO2 98 %. Body mass index is 54.58 kg/m.  General: Cooperative, alert, well developed, in no acute distress. HEENT: Conjunctivae and lids unremarkable. Cardiovascular: Regular rhythm.  Lungs: Normal work of breathing. Neurologic: No focal deficits.   Lab Results  Component Value Date   CREATININE 0.64 10/21/2019   BUN 11 10/21/2019   NA 137 10/21/2019   K 5.1 10/21/2019   CL 101 10/21/2019   CO2 23 10/21/2019   Lab Results  Component Value Date   ALT 16 10/21/2019   AST 17 10/21/2019   ALKPHOS 86 10/21/2019   BILITOT 0.3 10/21/2019   Lab Results  Component Value Date   HGBA1C 6.2 (H) 10/21/2019   HGBA1C 5.8 (H) 09/25/2018   Lab Results  Component Value Date   INSULIN 59.7 (H) 10/21/2019   Lab Results  Component Value Date   TSH 0.495 09/25/2018   Lab Results  Component Value Date   CHOL 153 10/21/2019   HDL 55 10/21/2019   LDLCALC 77 10/21/2019   TRIG 119 10/21/2019   Lab Results  Component Value Date   WBC 9.9 11/03/2009   HGB 10.5 (L) 11/03/2009   HCT 31.8 (L) 11/03/2009   MCV 83.2 11/03/2009   PLT 238 11/03/2009   No results found for: IRON, TIBC, FERRITIN  Attestation Statements:   Reviewed by clinician on day of visit: allergies, medications, problem list, medical history, surgical history, family history, social history, and previous encounter notes.  Migdalia Dk, am acting as Location manager for [employer], DO   I have reviewed the above documentation for accuracy and completeness, and I agree with the above. Jearld Lesch, DO

## 2020-02-01 MED FILL — VICTOZA 18 MG/3 ML INJECT P: 18 | 30 days supply | Qty: 9 | Fill #0

## 2020-02-02 ENCOUNTER — Encounter (INDEPENDENT_AMBULATORY_CARE_PROVIDER_SITE_OTHER): Payer: Self-pay | Admitting: Bariatrics

## 2020-02-18 ENCOUNTER — Encounter (INDEPENDENT_AMBULATORY_CARE_PROVIDER_SITE_OTHER): Payer: Self-pay | Admitting: Bariatrics

## 2020-02-18 ENCOUNTER — Ambulatory Visit (INDEPENDENT_AMBULATORY_CARE_PROVIDER_SITE_OTHER): Payer: Self-pay | Admitting: Bariatrics

## 2020-02-18 ENCOUNTER — Other Ambulatory Visit: Payer: Self-pay

## 2020-02-18 VITALS — BP 117/81 | HR 95 | Temp 98.7°F | Ht 65.0 in | Wt 325.0 lb

## 2020-02-18 DIAGNOSIS — Z9189 Other specified personal risk factors, not elsewhere classified: Secondary | ICD-10-CM

## 2020-02-18 DIAGNOSIS — R7303 Prediabetes: Secondary | ICD-10-CM

## 2020-02-18 DIAGNOSIS — F3289 Other specified depressive episodes: Secondary | ICD-10-CM

## 2020-02-18 DIAGNOSIS — Z6841 Body Mass Index (BMI) 40.0 and over, adult: Secondary | ICD-10-CM

## 2020-02-18 MED ORDER — TROKENDI XR 50 MG PO CP24: 50.0000 mg | ORAL_CAPSULE | Freq: Every day | ORAL | 0 refills | Status: DC

## 2020-02-18 MED ORDER — METFORMIN HCL 500 MG PO TABS: 500.0000 mg | ORAL_TABLET | Freq: Every day | ORAL | 0 refills | Status: DC

## 2020-02-18 NOTE — Progress Notes (Signed)
Chief Complaint:   Morgan Dudley is here to discuss her progress with her obesity treatment plan along with follow-up of her obesity related diagnoses. [name] is on the Category 4 Plan and journaling breakfast and states she is following her eating plan approximately 80% of the time. Morgan Dudley states she is walking 8,000-10,000 steps 7 times per week.  Today's visit was #: 8 Starting weight: 337 lbs Starting date: 10/21/2019 Today's weight: 325 lbs Today's date: 02/18/2020 Total lbs lost to date: 12 Total lbs lost since last in-office visit: 3  Interim History: Morgan Dudley is down 3 lbs.  Subjective:   Other depression, with emotional eating. [name] is struggling with emotional eating and using food for comfort to the extent that it is negatively impacting her health. She has been working on behavior modification techniques to help reduce her emotional eating and has been somewhat successful. She shows no sign of suicidal or homicidal ideations.  Prediabetes. Morgan Dudley has a diagnosis of prediabetes based on her elevated HgA1c and was informed this puts her at greater risk of developing diabetes. She continues to work on diet and exercise to decrease her risk of diabetes. She denies nausea or hypoglycemia. [name] is taking Victoza.  Lab Results  Component Value Date   HGBA1C 6.2 (H) 10/21/2019   Lab Results  Component Value Date   INSULIN 59.7 (H) 10/21/2019   At risk for constipation. Morgan Dudley is at increased risk for constipation secondary to Victoza - is using Metamucil. Morgan Dudley denies hard, infrequent stools currently.   Assessment/Plan:   Other depression, with emotional eating. Behavior modification techniques were discussed today to help Morgan Dudley deal with her emotional/non-hunger eating behaviors.  Orders and follow up as documented in patient record. Prescription was given for Topiramate ER (TROKENDI XR) 50 MG CP24 1 at 12:00 noon #30 with 0  refills.  Prediabetes. Morgan Dudley will continue to work on weight loss, exercise, and decreasing simple carbohydrates to help decrease the risk of diabetes. She will stop Victoza and will start metFORMIN (GLUCOPHAGE) 500 MG tablet 1 with lunch #30 with 0 refills.  At risk for constipation. Morgan Dudley was given approximately 15 minutes of counseling today regarding prevention of constipation. She was encouraged to increase water and fiber intake.   Class 3 severe obesity with serious comorbidity and body mass index (BMI) of 50.0 to 59.9 in adult, unspecified obesity type ([place]).  Morgan Dudley is currently in the action stage of change. As such, her goal is to continue with weight loss efforts. She has agreed to the Category 4 Plan and will journal 350-450 calories and 30+ grams of protein at breakfast.   She will work on meal planning and intentional eating.  Exercise goals: All adults should avoid inactivity. Some physical activity is better than none, and adults who participate in any amount of physical activity gain some health benefits.  Behavioral modification strategies: increasing lean protein intake, decreasing simple carbohydrates, increasing vegetables, increasing water intake, decreasing eating out, no skipping meals, meal planning and cooking strategies and keeping healthy foods in the home.  Morgan Dudley has agreed to follow-up with our clinic in 2 weeks. She was informed of the importance of frequent follow-up visits to maximize her success with intensive lifestyle modifications for her multiple health conditions.   Objective:   Blood pressure 117/81, pulse 95, temperature 98.7 F (37.1 C), height 5\' 5"  (1.651 m), weight (!) 325 lb (147.4 kg), last menstrual period 02/15/2020, SpO2 97 %. Body mass index is 54.08  kg/m.  General: Cooperative, alert, well developed, in no acute distress. HEENT: Conjunctivae and lids unremarkable. Cardiovascular: Regular rhythm.  Lungs: Normal work of  breathing. Neurologic: No focal deficits.   Lab Results  Component Value Date   CREATININE 0.64 10/21/2019   BUN 11 10/21/2019   NA 137 10/21/2019   K 5.1 10/21/2019   CL 101 10/21/2019   CO2 23 10/21/2019   Lab Results  Component Value Date   ALT 16 10/21/2019   AST 17 10/21/2019   ALKPHOS 86 10/21/2019   BILITOT 0.3 10/21/2019   Lab Results  Component Value Date   HGBA1C 6.2 (H) 10/21/2019   HGBA1C 5.8 (H) 09/25/2018   Lab Results  Component Value Date   INSULIN 59.7 (H) 10/21/2019   Lab Results  Component Value Date   TSH 0.495 09/25/2018   Lab Results  Component Value Date   CHOL 153 10/21/2019   HDL 55 10/21/2019   LDLCALC 77 10/21/2019   TRIG 119 10/21/2019   Lab Results  Component Value Date   WBC 9.9 11/03/2009   HGB 10.5 (L) 11/03/2009   HCT 31.8 (L) 11/03/2009   MCV 83.2 11/03/2009   PLT 238 11/03/2009   No results found for: IRON, TIBC, FERRITIN  Attestation Statements:   Reviewed by clinician on day of visit: allergies, medications, problem list, medical history, surgical history, family history, social history, and previous encounter notes.  Fernanda Drum, am acting as Energy manager for [employer], DO   I have reviewed the above documentation for accuracy and completeness, and I agree with the above. Corinna Capra, DO

## 2020-02-26 MED FILL — TRI-LO-ESTARYLLA 0.18/0.215: 0.18/0.215/ | 28 days supply | Qty: 28 | Fill #1

## 2020-03-03 ENCOUNTER — Ambulatory Visit (INDEPENDENT_AMBULATORY_CARE_PROVIDER_SITE_OTHER): Payer: Self-pay | Admitting: Bariatrics

## 2020-03-16 ENCOUNTER — Other Ambulatory Visit: Payer: Self-pay

## 2020-03-16 ENCOUNTER — Ambulatory Visit (INDEPENDENT_AMBULATORY_CARE_PROVIDER_SITE_OTHER): Payer: Self-pay | Admitting: Physician Assistant

## 2020-03-16 ENCOUNTER — Encounter (INDEPENDENT_AMBULATORY_CARE_PROVIDER_SITE_OTHER): Payer: Self-pay | Admitting: Physician Assistant

## 2020-03-16 VITALS — BP 119/81 | HR 88 | Temp 98.3°F | Ht 65.0 in | Wt 327.0 lb

## 2020-03-16 DIAGNOSIS — R7303 Prediabetes: Secondary | ICD-10-CM

## 2020-03-16 DIAGNOSIS — Z6841 Body Mass Index (BMI) 40.0 and over, adult: Secondary | ICD-10-CM

## 2020-03-16 DIAGNOSIS — E559 Vitamin D deficiency, unspecified: Secondary | ICD-10-CM

## 2020-03-16 DIAGNOSIS — Z9189 Other specified personal risk factors, not elsewhere classified: Secondary | ICD-10-CM

## 2020-03-16 MED ORDER — RYBELSUS 3 MG PO TABS: 1.0000 | ORAL_TABLET | Freq: Every day | ORAL | 0 refills | Status: DC

## 2020-03-16 MED ORDER — VITAMIN D (ERGOCALCIFEROL) 1.25 MG (50000 UNIT) PO CAPS: 50000.0000 [IU] | ORAL_CAPSULE | ORAL | 0 refills | Status: DC

## 2020-03-17 MED FILL — ?ERGOCALCIFEROL 50000 UNITC: 1.25 MG | 30 days supply | Qty: 4 | Fill #0

## 2020-03-17 NOTE — Progress Notes (Signed)
Chief Complaint:   Morgan Dudley is here to discuss her progress with her obesity treatment plan along with follow-up of her obesity related diagnoses. Morgan Dudley is on Morgan Category 4 Plan and states she is following her eating plan approximately 50-60% of Morgan time. Morgan Dudley states she is walking 7,000-8,000 steps daily.   Today's visit was #: 9 Starting weight: 337 lbs Starting date: 10/21/2019 Today's weight: 327 lbs Today's date: 03/16/2020 Total lbs lost to date: 10 Total lbs lost since last in-office visit: 0  Interim History: Morgan Dudley reports that since starting metformin and discontinuing Victoza she has been snacking more often. She is excessively hungry after lunch.  Subjective:   Prediabetes. Morgan Dudley has a diagnosis of prediabetes based on her elevated HgA1c and was informed this puts her at greater risk of developing diabetes. She continues to work on diet and exercise to decrease her risk of diabetes. She denies nausea or hypoglycemia. Morgan Dudley is on metformin only. She states it is not controlling her appetite like Victoza did and she is snacking more often.  Lab Results  Component Value Date   HGBA1C 6.2 (H) 10/21/2019   Lab Results  Component Value Date   INSULIN 59.7 (H) 10/21/2019   Vitamin D deficiency. Morgan Dudley is on prescription Vitamin D. No nausea, vomiting, or muscle weakness. Last Vitamin D was 20.3 on 10/21/2019.  At risk for diabetes mellitus. Morgan Dudley is at higher than average risk for developing diabetes due to her obesity.   Assessment/Plan:   Prediabetes. Morgan Dudley will continue to work on weight loss, exercise, and decreasing simple carbohydrates to help decrease Morgan risk of diabetes. She will start Semaglutide (RYBELSUS) 3 MG TABS QAM #30 with 0 refills.  Vitamin D deficiency. Low Vitamin D level contributes to fatigue and are associated with obesity, breast, and colon cancer. She was given a refill on her Vitamin D, Ergocalciferol,  (DRISDOL) 1.25 MG (50000 UNIT) CAPS capsule every week #4 with 0 refills and will follow-up for routine testing of Vitamin D, at least 2-3 times per year to avoid over-replacement.   At risk for diabetes mellitus. Morgan Dudley was given approximately 15 minutes of diabetes education and counseling today. We discussed intensive lifestyle modifications today with an emphasis on weight loss as well as increasing exercise and decreasing simple carbohydrates in her diet. We also reviewed medication options with an emphasis on risk versus benefit of those discussed.   Repetitive spaced learning was employed today to elicit superior memory formation and behavioral change.  Class 3 severe obesity with serious comorbidity and body mass index (BMI) of 50.0 to 59.9 in adult, unspecified obesity type (Morgan Dudley).  Morgan Dudley is currently in Morgan action stage of change. As such, her goal is to continue with weight loss efforts. She has agreed to Morgan Category 4 Plan. She was instructed to eat 6-7 oz of protein at lunch.  Exercise goals: For substantial health benefits, adults should do at least 150 minutes (2 hours and 30 minutes) a week of moderate-intensity, or 75 minutes (1 hour and 15 minutes) a week of vigorous-intensity aerobic physical activity, or an equivalent combination of moderate- and vigorous-intensity aerobic activity. Aerobic activity should be performed in episodes of at least 10 minutes, and preferably, it should be spread throughout Morgan week.  Behavioral modification strategies: meal planning and cooking strategies and keeping healthy foods in Morgan home.  Morgan Dudley has agreed to follow-up with our clinic in 2 weeks. She was informed of Morgan importance of frequent  follow-up visits to maximize her success with intensive lifestyle modifications for her multiple health conditions.   Objective:   Blood pressure 119/81, pulse 88, temperature 98.3 F (36.8 C), temperature source Oral, height 5\' 5"  (1.651 m), weight  (!) 327 lb (148.3 kg), last menstrual period 02/15/2020, SpO2 97 %. Body mass index is 54.42 kg/m.  General: Cooperative, alert, well developed, in no acute distress. HEENT: Conjunctivae and lids unremarkable. Cardiovascular: Regular rhythm.  Lungs: Normal work of breathing. Neurologic: No focal deficits.   Lab Results  Component Value Date   CREATININE 0.64 10/21/2019   BUN 11 10/21/2019   NA 137 10/21/2019   K 5.1 10/21/2019   CL 101 10/21/2019   CO2 23 10/21/2019   Lab Results  Component Value Date   ALT 16 10/21/2019   AST 17 10/21/2019   ALKPHOS 86 10/21/2019   BILITOT 0.3 10/21/2019   Lab Results  Component Value Date   HGBA1C 6.2 (H) 10/21/2019   HGBA1C 5.8 (H) 09/25/2018   Lab Results  Component Value Date   INSULIN 59.7 (H) 10/21/2019   Lab Results  Component Value Date   TSH 0.495 09/25/2018   Lab Results  Component Value Date   CHOL 153 10/21/2019   HDL 55 10/21/2019   LDLCALC 77 10/21/2019   TRIG 119 10/21/2019   Lab Results  Component Value Date   WBC 9.9 11/03/2009   HGB 10.5 (L) 11/03/2009   HCT 31.8 (L) 11/03/2009   MCV 83.2 11/03/2009   PLT 238 11/03/2009   No results found for: IRON, TIBC, FERRITIN  Attestation Statements:   Reviewed by clinician on day of visit: allergies, medications, problem list, medical history, surgical history, family history, social history, and previous encounter notes.  I01/29/2011, am acting as transcriptionist for Marianna Payment, PA-C   I have reviewed Morgan above documentation for accuracy and completeness, and I agree with Morgan above. Alois Cliche, PA-C

## 2020-03-24 ENCOUNTER — Other Ambulatory Visit (INDEPENDENT_AMBULATORY_CARE_PROVIDER_SITE_OTHER): Payer: Self-pay | Admitting: Bariatrics

## 2020-03-24 DIAGNOSIS — R7303 Prediabetes: Secondary | ICD-10-CM

## 2020-03-24 DIAGNOSIS — F3289 Other specified depressive episodes: Secondary | ICD-10-CM

## 2020-03-25 MED FILL — METFORMIN HCL 500 MG TABS: 500 | 30 days supply | Qty: 30 | Fill #0

## 2020-03-25 MED FILL — TOPIRAMATE 50 MG TABLET: 50 | 30 days supply | Qty: 30 | Fill #0

## 2020-03-30 ENCOUNTER — Other Ambulatory Visit: Payer: Self-pay

## 2020-03-30 ENCOUNTER — Ambulatory Visit (INDEPENDENT_AMBULATORY_CARE_PROVIDER_SITE_OTHER): Payer: Self-pay | Admitting: Bariatrics

## 2020-03-30 ENCOUNTER — Encounter (INDEPENDENT_AMBULATORY_CARE_PROVIDER_SITE_OTHER): Payer: Self-pay | Admitting: Bariatrics

## 2020-03-30 VITALS — BP 134/76 | HR 95 | Temp 99.0°F | Ht 65.0 in | Wt 325.0 lb

## 2020-03-30 DIAGNOSIS — Z789 Other specified health status: Secondary | ICD-10-CM

## 2020-03-30 DIAGNOSIS — R7303 Prediabetes: Secondary | ICD-10-CM

## 2020-03-30 DIAGNOSIS — E559 Vitamin D deficiency, unspecified: Secondary | ICD-10-CM

## 2020-03-30 DIAGNOSIS — Z6841 Body Mass Index (BMI) 40.0 and over, adult: Secondary | ICD-10-CM

## 2020-03-30 DIAGNOSIS — Z9189 Other specified personal risk factors, not elsewhere classified: Secondary | ICD-10-CM

## 2020-03-30 MED ORDER — NORGESTIM-ETH ESTRAD TRIPHASIC 0.18/0.215/0.25 MG-25 MCG PO TABS: 1.0000 | ORAL_TABLET | Freq: Every day | ORAL | 2 refills | Status: DC

## 2020-03-30 MED ORDER — METFORMIN HCL 500 MG PO TABS: ORAL_TABLET | ORAL | 0 refills | Status: DC

## 2020-03-30 MED FILL — TRI-LO-ESTARYLLA 0.18/0.215: 0.18/0.215/ | 28 days supply | Qty: 28 | Fill #0

## 2020-03-30 MED FILL — METFORMIN HCL 500 MG TABS: 500 | 30 days supply | Qty: 60 | Fill #0

## 2020-03-31 ENCOUNTER — Encounter (INDEPENDENT_AMBULATORY_CARE_PROVIDER_SITE_OTHER): Payer: Self-pay | Admitting: Bariatrics

## 2020-03-31 NOTE — Progress Notes (Signed)
Chief Complaint:   Morgan Dudley is here to discuss her progress with her obesity treatment plan along with follow-up of her obesity related diagnoses. Morgan Dudley is on the Category 4 Plan and states she is following her eating plan approximately 80% of the time. Morgan Dudley states she is walking 7,000-8,000 steps 7 times per week.  Today's visit was #: 10 Starting weight: 337 lbs Starting date: 10/21/2019 Today's weight: 325 lbs Today's date: 03/30/2020 Total lbs lost to date: 12 Total lbs lost since last in-office visit: 2  Interim History: Morgan Dudley is down an additional 2 lbs. Her only concern is that she may be having more cravings after stopping Victoza. She reports doing well with her water intake.  Subjective:   Prediabetes. Morgan Dudley has a diagnosis of prediabetes based on her elevated HgA1c and was informed this puts her at greater risk of developing diabetes. She continues to work on diet and exercise to decrease her risk of diabetes. She denies nausea or hypoglycemia. Morgan Dudley is taking metformin.  Lab Results  Component Value Date   HGBA1C 6.2 (H) 10/21/2019   Lab Results  Component Value Date   INSULIN 59.7 (H) 10/21/2019   Uses birth control. Morgan Dudley denies side effects with her medication.  Vitamin D deficiency. Morgan Dudley is taking Vitamin D supplementation. Last Vitamin D was 20.3 on 10/21/2019.  At risk for osteoporosis. Morgan Dudley is at higher risk of osteopenia and osteoporosis due to Vitamin D deficiency.   Assessment/Plan:   Prediabetes. Morgan Dudley will continue to work on weight loss, exercise, and decreasing simple carbohydrates to help decrease the risk of diabetes. She will continue metFORMIN (GLUCOPHAGE) 500 MG tablet 1 with breakfast and lunch #60 with 0 refills and will increase her activity.  Uses birth control. Prescription was given for Norgestimate-Ethinyl Estradiol Triphasic (TRI-LO-SPRINTEC) 0.18/0.215/0.25 MG-25 MCG tab 1 tablet daily,  #1 pack with 0 refills.  Vitamin D deficiency. Low Vitamin D level contributes to fatigue and are associated with obesity, breast, and colon cancer. She agrees to continue to take Vitamin D and will follow-up for routine testing of Vitamin D, at least 2-3 times per year to avoid over-replacement.  At risk for osteoporosis. Morgan Dudley was given approximately 15 minutes of osteoporosis prevention counseling today. Morgan Dudley is at risk for osteopenia and osteoporosis due to her Vitamin D deficiency. She was encouraged to take her Vitamin D and follow her higher calcium diet and increase strengthening exercise to help strengthen her bones and decrease her risk of osteopenia and osteoporosis.  Repetitive spaced learning was employed today to elicit superior memory formation and behavioral change.  Class 3 severe obesity with serious comorbidity and body mass index (BMI) of 50.0 to 59.9 in adult, unspecified obesity type (Morgan Dudley).  Morgan Dudley is currently in the action stage of change. As such, her goal is to continue with weight loss efforts. She has agreed to the Category 4 Plan.   She will work on meal planning and intentional eating.   Exercise goals: Morgan Dudley will continue walking 7,000-8,000 steps daily.  Behavioral modification strategies: increasing lean protein intake, decreasing simple carbohydrates, increasing vegetables, increasing water intake, decreasing eating out, no skipping meals, meal planning and cooking strategies, keeping healthy foods in the home and planning for success.  Morgan Dudley has agreed to follow-up with our clinic in 2 weeks. She was informed of the importance of frequent follow-up visits to maximize her success with intensive lifestyle modifications for her multiple health conditions.   Objective:   Blood  pressure 134/76, pulse 95, temperature 99 F (37.2 C), height 5\' 5"  (1.651 m), weight (!) 325 lb (147.4 kg), last menstrual period 03/29/2020, SpO2 97 %. Body mass index is  54.08 kg/m.  General: Cooperative, alert, well developed, in no acute distress. HEENT: Conjunctivae and lids unremarkable. Cardiovascular: Regular rhythm.  Lungs: Normal work of breathing. Neurologic: No focal deficits.   Lab Results  Component Value Date   CREATININE 0.64 10/21/2019   BUN 11 10/21/2019   NA 137 10/21/2019   K 5.1 10/21/2019   CL 101 10/21/2019   CO2 23 10/21/2019   Lab Results  Component Value Date   ALT 16 10/21/2019   AST 17 10/21/2019   ALKPHOS 86 10/21/2019   BILITOT 0.3 10/21/2019   Lab Results  Component Value Date   HGBA1C 6.2 (H) 10/21/2019   HGBA1C 5.8 (H) 09/25/2018   Lab Results  Component Value Date   INSULIN 59.7 (H) 10/21/2019   Lab Results  Component Value Date   TSH 0.495 09/25/2018   Lab Results  Component Value Date   CHOL 153 10/21/2019   HDL 55 10/21/2019   LDLCALC 77 10/21/2019   TRIG 119 10/21/2019   Lab Results  Component Value Date   WBC 9.9 11/03/2009   HGB 10.5 (L) 11/03/2009   HCT 31.8 (L) 11/03/2009   MCV 83.2 11/03/2009   PLT 238 11/03/2009   No results found for: IRON, TIBC, FERRITIN  Attestation Statements:   Reviewed by clinician on day of visit: allergies, medications, problem list, medical history, surgical history, family history, social history, and previous encounter notes.  11/05/2009, am acting as Morgan Dudley for Energy manager, DO   I have reviewed the above documentation for accuracy and completeness, and I agree with the above. [employer], DO

## 2020-04-01 MED FILL — ?ERGOCALCIFEROL 50000 UNITC: 1.25 MG | 30 days supply | Qty: 4 | Fill #0

## 2020-04-14 ENCOUNTER — Encounter (INDEPENDENT_AMBULATORY_CARE_PROVIDER_SITE_OTHER): Payer: Self-pay | Admitting: Bariatrics

## 2020-04-14 ENCOUNTER — Other Ambulatory Visit (INDEPENDENT_AMBULATORY_CARE_PROVIDER_SITE_OTHER): Payer: Self-pay | Admitting: Bariatrics

## 2020-04-14 ENCOUNTER — Other Ambulatory Visit: Payer: Self-pay

## 2020-04-14 ENCOUNTER — Ambulatory Visit (INDEPENDENT_AMBULATORY_CARE_PROVIDER_SITE_OTHER): Payer: Self-pay | Admitting: Bariatrics

## 2020-04-14 VITALS — BP 116/76 | HR 85 | Temp 98.6°F | Ht 65.0 in | Wt 325.0 lb

## 2020-04-14 DIAGNOSIS — E559 Vitamin D deficiency, unspecified: Secondary | ICD-10-CM

## 2020-04-14 DIAGNOSIS — R7303 Prediabetes: Secondary | ICD-10-CM

## 2020-04-14 DIAGNOSIS — Z6841 Body Mass Index (BMI) 40.0 and over, adult: Secondary | ICD-10-CM

## 2020-04-14 DIAGNOSIS — Z789 Other specified health status: Secondary | ICD-10-CM

## 2020-04-14 DIAGNOSIS — Z9189 Other specified personal risk factors, not elsewhere classified: Secondary | ICD-10-CM

## 2020-04-14 DIAGNOSIS — F3289 Other specified depressive episodes: Secondary | ICD-10-CM

## 2020-04-14 MED ORDER — VITAMIN D (ERGOCALCIFEROL) 1.25 MG (50000 UNIT) PO CAPS: 50000.0000 [IU] | ORAL_CAPSULE | ORAL | 0 refills | Status: DC

## 2020-04-14 MED ORDER — NORGESTIM-ETH ESTRAD TRIPHASIC 0.18/0.215/0.25 MG-25 MCG PO TABS: 1.0000 | ORAL_TABLET | Freq: Every day | ORAL | 2 refills | Status: DC

## 2020-04-14 MED ORDER — METFORMIN HCL 500 MG PO TABS: ORAL_TABLET | ORAL | 0 refills | Status: DC

## 2020-04-14 MED ORDER — TOPIRAMATE 50 MG PO TABS: ORAL_TABLET | ORAL | 0 refills | Status: DC

## 2020-04-14 NOTE — Progress Notes (Signed)
Chief Complaint:   OBESITY Morgan Dudley is here to discuss her progress with her obesity treatment plan along with follow-up of her obesity related diagnoses. [name] is on the Category 4 Plan and states she is following her eating plan approximately 80% of the time. Morgan Dudley states she is walking 8,000-10,000 steps 7 times per week.  Today's visit was #: 11 Starting weight: 337 lbs Starting date: 10/21/2019 Today's weight: 322 lbs Today's date: 04/14/2020 Total lbs lost to date: 15 Total lbs lost since last in-office visit: 3  Interim History: Morgan Dudley is down an additional 3 lbs and is doing well overall. She reports doing well with her water and protein intake.  Subjective:   Prediabetes. Morgan Dudley has a diagnosis of prediabetes based on her elevated HgA1c and was informed this puts her at greater risk of developing diabetes. She continues to work on diet and exercise to decrease her risk of diabetes. She denies nausea or hypoglycemia. [name] is taking metformin and reports having a normal appetite.  Lab Results  Component Value Date   HGBA1C 6.2 (H) 10/21/2019   Lab Results  Component Value Date   INSULIN 59.7 (H) 10/21/2019   Other depression, with emotional eating. Morgan Dudley is struggling with emotional eating and using food for comfort to the extent that it is negatively impacting her health. She has been working on behavior modification techniques to help reduce her emotional eating and has been somewhat successful. She shows no sign of suicidal or homicidal ideations. Morgan Dudley endorses emotional/stress eating.  Vitamin D deficiency. No nausea, vomiting, or muscle weakness.    Ref. Range 10/21/2019 11:54  Vitamin D, 25-Hydroxy Latest Ref Range: 30.0 - 100.0 ng/mL 20.3 (L)   Uses birth control. Morgan Dudley denies side effects and denies smoking.  At risk for hypoglycemia. [name] is at increased risk for hypoglycemia due to prediabetes.  Assessment/Plan:    Prediabetes. [name] will continue to work on weight loss, exercise, and decreasing simple carbohydrates to help decrease the risk of diabetes. Prescription was given for metFORMIN (GLUCOPHAGE) 500 MG tablet 1 PO with breakfast and lunch #60 with 0 refills.  Other depression, with emotional eating. Behavior modification techniques were discussed today to help Morgan Dudley deal with her emotional/non-hunger eating behaviors.  Orders and follow up as documented in patient record. Prescription was given for topiramate (TOPAMAX) 50 MG tablet 1 PO daily at noon #30 with 0 refills.  Vitamin D deficiency. Low Vitamin D level contributes to fatigue and are associated with obesity, breast, and colon cancer. She was given a prescription for Vitamin D, Ergocalciferol, (DRISDOL) 1.25 MG (50000 UNIT) CAPS capsule every week #4 with 0 refills and will follow-up for routine testing of Vitamin D, at least 2-3 times per year to avoid over-replacement.   Uses birth control. Prescription was given for Norgestimate-Ethinyl Estradiol Triphasic (TRI-LO-SPRINTEC) 0.18/0.215/0.25 MG-25 MCG tab 1 PO daily, #1 pack with 1 refill. She was advised to schedule a pap smear appointment in the near future.  At risk for hypoglycemia. Morgan Dudley was given approximately 15 minutes of counseling today regarding prevention of hypoglycemia. She was advised of symptoms of hypoglycemia. Morgan Dudley was instructed to avoid skipping meals, eat regular protein rich meals and schedule low calorie snacks as needed.   Repetitive spaced learning was employed today to elicit superior memory formation and behavioral change.  Class 3 severe obesity with serious comorbidity and body mass index (BMI) of 50.0 to 59.9 in adult, unspecified obesity type ([facility]).  Morgan Dudley is currently  in the action stage of change. As such, her goal is to continue with weight loss efforts. She has agreed to the Category 4 Plan.   She will work on meal planning and intentional  eating.   Exercise goals: Morgan Dudley will continue walking for exercise.  Behavioral modification strategies: increasing lean protein intake, decreasing simple carbohydrates, increasing vegetables, increasing water intake, decreasing eating out, no skipping meals, meal planning and cooking strategies, keeping healthy foods in the home and planning for success.  Morgan Dudley has agreed to follow-up with our clinic in 3 weeks. She was informed of the importance of frequent follow-up visits to maximize her success with intensive lifestyle modifications for her multiple health conditions.   Objective:   Blood pressure 116/76, pulse 85, temperature 98.6 F (37 C), last menstrual period 03/29/2020, SpO2 96 %. There is no height or weight on file to calculate BMI.  General: Cooperative, alert, well developed, in no acute distress. HEENT: Conjunctivae and lids unremarkable. Cardiovascular: Regular rhythm.  Lungs: Normal work of breathing. Neurologic: No focal deficits.   Lab Results  Component Value Date   CREATININE 0.64 10/21/2019   BUN 11 10/21/2019   NA 137 10/21/2019   K 5.1 10/21/2019   CL 101 10/21/2019   CO2 23 10/21/2019   Lab Results  Component Value Date   ALT 16 10/21/2019   AST 17 10/21/2019   ALKPHOS 86 10/21/2019   BILITOT 0.3 10/21/2019   Lab Results  Component Value Date   HGBA1C 6.2 (H) 10/21/2019   HGBA1C 5.8 (H) 09/25/2018   Lab Results  Component Value Date   INSULIN 59.7 (H) 10/21/2019   Lab Results  Component Value Date   TSH 0.495 09/25/2018   Lab Results  Component Value Date   CHOL 153 10/21/2019   HDL 55 10/21/2019   LDLCALC 77 10/21/2019   TRIG 119 10/21/2019   Lab Results  Component Value Date   WBC 9.9 11/03/2009   HGB 10.5 (L) 11/03/2009   HCT 31.8 (L) 11/03/2009   MCV 83.2 11/03/2009   PLT 238 11/03/2009   No results found for: IRON, TIBC, FERRITIN  Attestation Statements:   Reviewed by clinician on day of visit: allergies,  medications, problem list, medical history, surgical history, family history, social history, and previous encounter notes.  Fernanda Drum, am acting as Energy manager for [employer], DO   I have reviewed the above documentation for accuracy and completeness, and I agree with the above. Corinna Capra, DO

## 2020-04-18 ENCOUNTER — Encounter (INDEPENDENT_AMBULATORY_CARE_PROVIDER_SITE_OTHER): Payer: Self-pay | Admitting: Bariatrics

## 2020-05-09 ENCOUNTER — Other Ambulatory Visit: Payer: Self-pay

## 2020-05-09 ENCOUNTER — Encounter (INDEPENDENT_AMBULATORY_CARE_PROVIDER_SITE_OTHER): Payer: Self-pay | Admitting: Bariatrics

## 2020-05-09 ENCOUNTER — Ambulatory Visit (INDEPENDENT_AMBULATORY_CARE_PROVIDER_SITE_OTHER): Payer: Self-pay | Admitting: Bariatrics

## 2020-05-09 VITALS — BP 128/60 | HR 76 | Temp 98.6°F | Ht 65.0 in | Wt 320.0 lb

## 2020-05-09 DIAGNOSIS — E559 Vitamin D deficiency, unspecified: Secondary | ICD-10-CM

## 2020-05-09 DIAGNOSIS — Z6841 Body Mass Index (BMI) 40.0 and over, adult: Secondary | ICD-10-CM

## 2020-05-09 DIAGNOSIS — Z9189 Other specified personal risk factors, not elsewhere classified: Secondary | ICD-10-CM

## 2020-05-09 DIAGNOSIS — R7303 Prediabetes: Secondary | ICD-10-CM

## 2020-05-09 DIAGNOSIS — E66813 Obesity, class 3: Secondary | ICD-10-CM

## 2020-05-09 MED ORDER — VICTOZA 18 MG/3ML ~~LOC~~ SOPN: 0.6000 mg | PEN_INJECTOR | Freq: Every day | SUBCUTANEOUS | 0 refills | Status: DC

## 2020-05-09 MED ORDER — METFORMIN HCL 500 MG PO TABS: ORAL_TABLET | ORAL | 0 refills | Status: DC

## 2020-05-09 MED ORDER — VITAMIN D (ERGOCALCIFEROL) 1.25 MG (50000 UNIT) PO CAPS: 50000.0000 [IU] | ORAL_CAPSULE | ORAL | 0 refills | Status: DC

## 2020-05-09 MED FILL — METFORMIN HCL 500 MG TABS: 500 | 30 days supply | Qty: 60 | Fill #0

## 2020-05-09 MED FILL — ?ERGOCALCIFEROL 50000 UNITC: 1.25 MG | 28 days supply | Qty: 4 | Fill #0

## 2020-05-09 MED FILL — VICTOZA 18 MG/3 ML INJECT P: 18 | 30 days supply | Qty: 3 | Fill #0

## 2020-05-09 NOTE — Progress Notes (Signed)
Chief Complaint:   OBESITY Morgan Dudley is here to discuss her progress with her obesity treatment plan along with follow-up of her obesity related diagnoses. Morgan Dudley is on the Category 4 Plan and states she is following her eating plan approximately 80% of the time. Morgan Dudley states she is walking 10,000 steps 7 times per week.  Today's visit was #: 12 Starting weight: 337 lbs Starting date: 10/21/2019 Today's weight: 320 lbs Today's date: 05/09/2020 Total lbs lost to date: 17 Total lbs lost since last in-office visit: 5  Interim History: Morgan Dudley is down an additional 5 lbs since her last visit. She is eating more protein. She does report having more headaches with Topamax.  Subjective:   Vitamin D deficiency. Morgan Dudley is taking high dose Vitamin D.   Ref. Range 10/21/2019 11:54  Vitamin D, 25-Hydroxy Latest Ref Range: 30.0 - 100.0 ng/mL 20.3 (L)   Prediabetes. Morgan Dudley has a diagnosis of prediabetes based on her elevated HgA1c and was informed this puts her at greater risk of developing diabetes. She continues to work on diet and exercise to decrease her risk of diabetes. She denies nausea or hypoglycemia. Morgan Dudley is taking metformin.  Lab Results  Component Value Date   HGBA1C 6.2 (H) 10/21/2019   Lab Results  Component Value Date   INSULIN 59.7 (H) 10/21/2019   At risk for activity intolerance. Morgan Dudley is at risk of exercise intolerance due to obesity.  Assessment/Plan:   Vitamin D deficiency. Low Vitamin D level contributes to fatigue and are associated with obesity, breast, and colon cancer. She was given a prescription for Vitamin D, Ergocalciferol, (DRISDOL) 1.25 MG (50000 UNIT) CAPS capsule every week #4 with 0 refills and will follow-up for routine testing of Vitamin D, at least 2-3 times per year to avoid over-replacement.   Prediabetes. Morgan Dudley will continue to work on weight loss, exercise, and decreasing simple carbohydrates to help decrease the risk  of diabetes. She will resume the liraglutide (VICTOZA) 18 MG/3ML SOPN 0.6 mg daily, 1 month supply. Refill was given for metFORMIN (GLUCOPHAGE) 500 MG tablet 1 with breakfast and lunch #60 with 0 refills.  At risk for activity intolerance. Morgan Dudley was given approximately 15 minutes of exercise intolerance counseling today. She is 34 y.o. female and has risk factors exercise intolerance including obesity. We discussed intensive lifestyle modifications today with an emphasis on specific weight loss instructions and strategies. Morgan Dudley will slowly increase activity as tolerated.  Repetitive spaced learning was employed today to elicit superior memory formation and behavioral change.  Class 3 severe obesity with serious comorbidity and body mass index (BMI) of 50.0 to 59.9 in adult, unspecified obesity type (HCC).  Morgan Dudley is currently in the action stage of change. As such, her goal is to continue with weight loss efforts. She has agreed to the Category 4 Plan.   She will work on meal planning and intentional eating. She will stop the Topamax.  Exercise goals: All adults should avoid inactivity. Some physical activity is better than none, and adults who participate in any amount of physical activity gain some health benefits.  Behavioral modification strategies: increasing lean protein intake, decreasing simple carbohydrates, increasing vegetables, increasing water intake, decreasing eating out, no skipping meals, meal planning and cooking strategies, keeping healthy foods in the home and planning for success.  Morgan Dudley has agreed to follow-up with our clinic fasting in 2-3 weeks. She was informed of the importance of frequent follow-up visits to maximize her success with intensive lifestyle  modifications for her multiple health conditions.   Objective:   Blood pressure 128/60, pulse 76, temperature 98.6 F (37 C), height 5\' 5"  (1.651 m), weight (!) 320 lb (145.2 kg), last menstrual period  05/09/2020, SpO2 96 %. Body mass index is 53.25 kg/m.  General: Cooperative, alert, well developed, in no acute distress. HEENT: Conjunctivae and lids unremarkable. Cardiovascular: Regular rhythm.  Lungs: Normal work of breathing. Neurologic: No focal deficits.   Lab Results  Component Value Date   CREATININE 0.64 10/21/2019   BUN 11 10/21/2019   NA 137 10/21/2019   K 5.1 10/21/2019   CL 101 10/21/2019   CO2 23 10/21/2019   Lab Results  Component Value Date   ALT 16 10/21/2019   AST 17 10/21/2019   ALKPHOS 86 10/21/2019   BILITOT 0.3 10/21/2019   Lab Results  Component Value Date   HGBA1C 6.2 (H) 10/21/2019   HGBA1C 5.8 (H) 09/25/2018   Lab Results  Component Value Date   INSULIN 59.7 (H) 10/21/2019   Lab Results  Component Value Date   TSH 0.495 09/25/2018   Lab Results  Component Value Date   CHOL 153 10/21/2019   HDL 55 10/21/2019   LDLCALC 77 10/21/2019   TRIG 119 10/21/2019   Lab Results  Component Value Date   WBC 9.9 11/03/2009   HGB 10.5 (L) 11/03/2009   HCT 31.8 (L) 11/03/2009   MCV 83.2 11/03/2009   PLT 238 11/03/2009   No results found for: IRON, TIBC, FERRITIN  Attestation Statements:   Reviewed by clinician on day of visit: allergies, medications, problem list, medical history, surgical history, family history, social history, and previous encounter notes.  11/05/2009, am acting as Fernanda Drum for Energy manager, DO   I have reviewed the above documentation for accuracy and completeness, and I agree with the above. [employer], DO

## 2020-05-17 MED FILL — ?ERGOCALCIFEROL 50000 UNITC: 1.25 MG | 28 days supply | Qty: 4 | Fill #0

## 2020-05-17 MED FILL — !VICTOZA 18MG/3ML INJECT: 18 | 30 days supply | Qty: 3 | Fill #0

## 2020-05-19 MED FILL — TRUEplus 5-BEVEL PEN NEEDLE: 32G X 4 MM | 50 days supply | Qty: 100 | Fill #1

## 2020-05-19 MED FILL — TRI-LO-ESTARYLLA 0.18/0.215: 0.18/0.215/ | 28 days supply | Qty: 28 | Fill #0

## 2020-05-19 MED FILL — METFORMIN HCL 500 MG TABS: 500 | 30 days supply | Qty: 60 | Fill #0

## 2020-05-24 ENCOUNTER — Telehealth: Payer: Self-pay

## 2020-05-24 ENCOUNTER — Encounter: Payer: Self-pay | Admitting: Internal Medicine

## 2020-05-24 NOTE — Telephone Encounter (Signed)
Called patient to do their pre-visit COVID screening.  Call went to voicemail, which isn't set up. Unable to do prescreening.

## 2020-05-25 ENCOUNTER — Ambulatory Visit: Payer: Self-pay

## 2020-05-25 ENCOUNTER — Encounter: Payer: Self-pay | Admitting: Internal Medicine

## 2020-05-25 ENCOUNTER — Ambulatory Visit (INDEPENDENT_AMBULATORY_CARE_PROVIDER_SITE_OTHER): Payer: Self-pay | Admitting: Internal Medicine

## 2020-05-25 ENCOUNTER — Other Ambulatory Visit (HOSPITAL_COMMUNITY)
Admission: RE | Admit: 2020-05-25 | Discharge: 2020-05-25 | Disposition: A | Discharge status: Home / Self Care | Payer: Self-pay | Source: Ambulatory Visit | Attending: Internal Medicine | Admitting: Internal Medicine

## 2020-05-25 ENCOUNTER — Other Ambulatory Visit: Payer: Self-pay

## 2020-05-25 VITALS — BP 132/82 | HR 87 | Temp 97.3°F | Resp 17 | Wt 317.0 lb

## 2020-05-25 DIAGNOSIS — Z113 Encounter for screening for infections with a predominantly sexual mode of transmission: Secondary | ICD-10-CM

## 2020-05-25 DIAGNOSIS — H00015 Hordeolum externum left lower eyelid: Secondary | ICD-10-CM

## 2020-05-25 DIAGNOSIS — Z1159 Encounter for screening for other viral diseases: Secondary | ICD-10-CM

## 2020-05-25 DIAGNOSIS — Z124 Encounter for screening for malignant neoplasm of cervix: Secondary | ICD-10-CM

## 2020-05-25 DIAGNOSIS — Z0000 Encounter for general adult medical examination without abnormal findings: Secondary | ICD-10-CM

## 2020-05-25 MED ORDER — ERYTHROMYCIN 5 MG/GM OP OINT: 1.0000 "application " | TOPICAL_OINTMENT | Freq: Every day | OPHTHALMIC | 0 refills | Status: DC

## 2020-05-25 MED FILL — ERYTHROMYCIN EYE OINTMENT: 5 | 15 days supply | Qty: 4 | Fill #0

## 2020-05-25 NOTE — Progress Notes (Signed)
Subjective:    Morgan Dudley - 34 y.o. female MRN [id number]  Date of birth: 1986/03/29  HPI  Morgan Dudley is here for annual exam. No history of abnormal PAPs. Accepts STD testing. Followed regularly at [facility]. Reports weight loss has been going well. Does have concerns about left lower eyelid lesion. Was there last year and improved with antibiotics but then seemed to return. No vision changes. No foreign body sensation or irritation. No redness of eyes. Lesion has not drained and is not particularly painful. Does wear contacts.      Health Maintenance:  Health Maintenance Due  Topic Date Due  . Hepatitis C Screening  Never done  . COVID-19 Vaccine (1) Never done  . HIV Screening  Never done  . PAP SMEAR-Modifier  Never done  . INFLUENZA VACCINE  05/08/2020    -  reports that she has never smoked. She has never used smokeless tobacco. - Review of Systems: Per HPI. - Past Medical History: Patient Active Problem List   Diagnosis Date Noted  . Vitamin D insufficiency 10/22/2019  . Prediabetes 10/22/2019  . Morbid obesity (HCC) 06/02/2019   - Medications: reviewed and updated   Objective:   Physical Exam Temp (!) 97.3 F (36.3 C) (Temporal)   Resp 17   Wt (!) 317 lb (143.8 kg)   LMP 05/09/2020   BMI 52.75 kg/m  Physical Exam Constitutional:      Appearance: She is not diaphoretic.  HENT:     Head: Normocephalic and atraumatic.  Eyes:     Conjunctiva/sclera: Conjunctivae normal.     Pupils: Pupils are equal, round, and reactive to light.     Comments: Small firm lesion on left inner lower eyelid. Does not appear inflamed.   Neck:     Thyroid: No thyromegaly.  Cardiovascular:     Rate and Rhythm: Normal rate and regular rhythm.     Heart sounds: Normal heart sounds. No murmur heard.   Pulmonary:     Effort: Pulmonary effort is normal. No respiratory distress.     Breath sounds: Normal breath sounds. No wheezing.   Abdominal:     General: Bowel sounds are normal. There is no distension.     Palpations: Abdomen is soft.     Tenderness: There is no abdominal tenderness. There is no guarding or rebound.  Genitourinary:    Comments: GU/GYN: Exam performed in the presence of a chaperone. External genitalia within normal limits.  Vaginal mucosa pink, moist, normal rugae.  Nonfriable cervix without lesions, no discharge or bleeding noted on speculum exam.  Bimanual exam revealed normal, nongravid uterus.  No cervical motion tenderness. No adnexal masses bilaterally.    Musculoskeletal:        General: No deformity. Normal range of motion.     Cervical back: Normal range of motion and neck supple.  Lymphadenopathy:     Cervical: No cervical adenopathy.  Skin:    General: Skin is warm and dry.     Findings: No rash.  Neurological:     Mental Status: She is alert and oriented to person, place, and time.     Gait: Gait is intact.  Psychiatric:        Mood and Affect: Mood and affect normal.        Judgment: Judgment normal.            Assessment & Plan:   1. Annual physical exam Counseled on 150 minutes of  exercise per week, healthy eating (including decreased daily intake of saturated fats, cholesterol, added sugars, sodium), STI prevention, routine healthcare maintenance. - CBC with Differential - Comprehensive metabolic panel  2. Pap smear for cervical cancer screening - Cytology - PAP([facility])  3. Screening for STDs (sexually transmitted diseases) - Cervicovaginal ancillary only - RPR - HIV antibody (with reflex)  4. Need for hepatitis C screening test - HCV Ab w/Rflx to Verification  5. 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 Chalazion. Has been recurrent but responsive to antibiotics in past. Repeat course of Erythromycin. Encouraged use of warm compresses and avoidance of contact lenses. Referral due to recurrent, chronic nature.  - erythromycin ophthalmic ointment;  Place 1 application into the left eye at bedtime.  Dispense: 3.5 g; Refill: 0 - Ambulatory referral to Ophthalmology    Marcy Siren, D.O. 05/25/2020, 8:44 AM Primary Care at [facility]

## 2020-05-26 ENCOUNTER — Other Ambulatory Visit: Payer: Self-pay | Admitting: Internal Medicine

## 2020-05-26 ENCOUNTER — Ambulatory Visit: Payer: Self-pay

## 2020-05-26 LAB — CBC WITH DIFFERENTIAL/PLATELET
Basophils Absolute: 0 10*3/uL (ref 0.0–0.2)
Basos: 0 %
EOS (ABSOLUTE): 0.3 10*3/uL (ref 0.0–0.4)
Eos: 4 %
Hematocrit: 40 % (ref 34.0–46.6)
Hemoglobin: 13.2 g/dL (ref 11.1–15.9)
Immature Grans (Abs): 0.1 10*3/uL (ref 0.0–0.1)
Immature Granulocytes: 1 %
Lymphocytes Absolute: 1.5 10*3/uL (ref 0.7–3.1)
Lymphs: 20 %
MCH: 26.3 pg — ABNORMAL LOW (ref 26.6–33.0)
MCHC: 33 g/dL (ref 31.5–35.7)
MCV: 80 fL (ref 79–97)
Monocytes Absolute: 0.8 10*3/uL (ref 0.1–0.9)
Monocytes: 10 %
Neutrophils Absolute: 5 10*3/uL (ref 1.4–7.0)
Neutrophils: 65 %
Platelets: 344 10*3/uL (ref 150–450)
RBC: 5.01 x10E6/uL (ref 3.77–5.28)
RDW: 13.3 % (ref 11.7–15.4)
WBC: 7.6 10*3/uL (ref 3.4–10.8)

## 2020-05-26 LAB — COMPREHENSIVE METABOLIC PANEL
ALT: 18 IU/L (ref 0–32)
AST: 15 IU/L (ref 0–40)
Albumin/Globulin Ratio: 1.7 (ref 1.2–2.2)
Albumin: 4.4 g/dL (ref 3.8–4.8)
Alkaline Phosphatase: 87 IU/L (ref 48–121)
BUN/Creatinine Ratio: 16 (ref 9–23)
BUN: 10 mg/dL (ref 6–20)
Bilirubin Total: 0.2 mg/dL (ref 0.0–1.2)
CO2: 20 mmol/L (ref 20–29)
Calcium: 9.2 mg/dL (ref 8.7–10.2)
Chloride: 105 mmol/L (ref 96–106)
Creatinine, Ser: 0.63 mg/dL (ref 0.57–1.00)
GFR calc Af Amer: 134 mL/min/{1.73_m2} (ref >59)
GFR calc non Af Amer: 117 mL/min/{1.73_m2} (ref >59)
Globulin, Total: 2.6 g/dL (ref 1.5–4.5)
Glucose: 111 mg/dL — ABNORMAL HIGH (ref 65–99)
Potassium: 4.3 mmol/L (ref 3.5–5.2)
Sodium: 138 mmol/L (ref 134–144)
Total Protein: 7 g/dL (ref 6.0–8.5)

## 2020-05-26 LAB — RPR: RPR Ser Ql: NONREACTIVE

## 2020-05-26 LAB — CERVICOVAGINAL ANCILLARY ONLY
Bacterial Vaginitis (gardnerella): POSITIVE — AB
Candida Glabrata: NEGATIVE
Candida Vaginitis: NEGATIVE
Chlamydia: NEGATIVE
Comment: NEGATIVE
Comment: NEGATIVE
Comment: NEGATIVE
Comment: NEGATIVE
Comment: NEGATIVE
Comment: NORMAL
Neisseria Gonorrhea: NEGATIVE
Trichomonas: NEGATIVE

## 2020-05-26 LAB — HCV INTERPRETATION

## 2020-05-26 LAB — HIV ANTIBODY (ROUTINE TESTING W REFLEX): HIV Screen 4th Generation wRfx: NONREACTIVE

## 2020-05-26 LAB — HCV AB W/RFLX TO VERIFICATION: HCV Ab: <0.1 s/co ratio (ref 0.0–0.9)

## 2020-05-26 MED ORDER — METRONIDAZOLE 500 MG PO TABS: 500.0000 mg | ORAL_TABLET | Freq: Two times a day (BID) | ORAL | 0 refills | Status: DC

## 2020-05-27 LAB — CYTOLOGY - PAP
Comment: NEGATIVE
Diagnosis: HIGH — AB
High risk HPV: NEGATIVE

## 2020-05-27 MED FILL — metroNIDAZOLE 500 MG TABS: 500 | 7 days supply | Qty: 14 | Fill #0

## 2020-05-30 ENCOUNTER — Other Ambulatory Visit: Payer: Self-pay | Admitting: Internal Medicine

## 2020-05-30 ENCOUNTER — Encounter: Payer: Self-pay | Admitting: Internal Medicine

## 2020-05-30 DIAGNOSIS — R87611 Atypical squamous cells cannot exclude high grade squamous intraepithelial lesion on cytologic smear of cervix (ASC-H): Secondary | ICD-10-CM | POA: Insufficient documentation

## 2020-05-31 ENCOUNTER — Encounter (INDEPENDENT_AMBULATORY_CARE_PROVIDER_SITE_OTHER): Payer: Self-pay | Admitting: Bariatrics

## 2020-05-31 ENCOUNTER — Ambulatory Visit (INDEPENDENT_AMBULATORY_CARE_PROVIDER_SITE_OTHER): Payer: Self-pay | Admitting: Bariatrics

## 2020-05-31 ENCOUNTER — Other Ambulatory Visit: Payer: Self-pay

## 2020-05-31 ENCOUNTER — Other Ambulatory Visit (INDEPENDENT_AMBULATORY_CARE_PROVIDER_SITE_OTHER): Payer: Self-pay | Admitting: Bariatrics

## 2020-05-31 VITALS — BP 135/84 | HR 86 | Temp 98.2°F | Ht 65.0 in | Wt 319.0 lb

## 2020-05-31 DIAGNOSIS — Z789 Other specified health status: Secondary | ICD-10-CM

## 2020-05-31 DIAGNOSIS — Z6841 Body Mass Index (BMI) 40.0 and over, adult: Secondary | ICD-10-CM

## 2020-05-31 DIAGNOSIS — E559 Vitamin D deficiency, unspecified: Secondary | ICD-10-CM

## 2020-05-31 DIAGNOSIS — Z9189 Other specified personal risk factors, not elsewhere classified: Secondary | ICD-10-CM

## 2020-05-31 MED ORDER — NORGESTIM-ETH ESTRAD TRIPHASIC 0.18/0.215/0.25 MG-25 MCG PO TABS: 1.0000 | ORAL_TABLET | Freq: Every day | ORAL | 2 refills | Status: DC

## 2020-05-31 MED ORDER — VITAMIN D (ERGOCALCIFEROL) 1.25 MG (50000 UNIT) PO CAPS: 50000.0000 [IU] | ORAL_CAPSULE | ORAL | 0 refills | Status: DC

## 2020-05-31 NOTE — Progress Notes (Signed)
Chief Complaint:   OBESITY Morgan Dudley is here to discuss her progress with her obesity treatment plan along with follow-up of her obesity related diagnoses. Mackinley is on the Category 4 Plan and states she is following her eating plan approximately 70% of the time. Morgan Dudley states she is walking 90 minutes 7 times per week.  Today's visit was #: 13 Starting weight: 337 lbs Starting date: 10/21/2019 Today's weight: 319 lbs Today's date: 05/31/2020 Total lbs lost to date: 18 Total lbs lost since last in-office visit: 1  Interim History: Morgan Dudley   Subjective:   Uses birth control. Morgan Dudley is taking Norgestimate-Ethinyl Estradiol triphasic birth control pills and denies side effects. She had a pap last week.  Vitamin D deficiency. No nausea, vomiting, or muscle weakness.    Ref. Range 10/21/2019 11:54  Vitamin D, 25-Hydroxy Latest Ref Range: 30.0 - 100.0 ng/mL 20.3 (L)   At risk for osteoporosis. Morgan Dudley is at higher risk of osteopenia and osteoporosis due to Vitamin D deficiency.   Assessment/Plan:   Uses birth control. Prescription was given for Norgestimate-Ethinyl Estradiol Triphasic (TRI-LO-SPRINTEC) 0.18/0.215/0.25 MG-25 MCG tab 1 pack with 2 refills.  Vitamin D deficiency. Low Vitamin D level contributes to fatigue and are associated with obesity, breast, and colon cancer. She was given a prescription for Vitamin D, Ergocalciferol, (DRISDOL) 1.25 MG (50000 UNIT) CAPS capsule every week #4 with 0 refills and will follow-up for routine testing of Vitamin D, at least 2-3 times per year to avoid over-replacement.   At risk for osteoporosis. Morgan Dudley was given approximately 5 minutes of osteoporosis prevention counseling today. [name] is at risk for osteopenia and osteoporosis due to her Vitamin D deficiency. She was encouraged to take her Vitamin D and follow her higher calcium diet and increase strengthening exercise to help strengthen her bones and decrease her  risk of osteopenia and osteoporosis.  Repetitive spaced learning was employed today to elicit superior memory formation and behavioral change.  Class 3 severe obesity with serious comorbidity and body mass index (BMI) of 50.0 to 59.9 in adult, unspecified obesity type (HCC).  Morgan Dudley is currently in the action stage of change. As such, her goal is to continue with weight loss efforts. She has agreed to the Category 4 Plan.   She will work on meal planning.  Exercise goals: Morgan Dudley will continue walking for exercise.  Behavioral modification strategies: increasing lean protein intake, decreasing simple carbohydrates, increasing vegetables, increasing water intake, decreasing eating out, no skipping meals, meal planning and cooking strategies, keeping healthy foods in the home and planning for success.  Morgan Dudley has agreed to follow-up with our clinic fasting in 2-3 weeks. She was informed of the importance of frequent follow-up visits to maximize her success with intensive lifestyle modifications for her multiple health conditions.   Objective:   Blood pressure 135/84, pulse 86, temperature 98.2 F (36.8 C), temperature source Oral, height 5\' 5"  (1.651 m), weight (!) 319 lb (144.7 kg), last menstrual period 05/09/2020, SpO2 98 %. Body mass index is 53.08 kg/m.  General: Cooperative, alert, well developed, in no acute distress. HEENT: Conjunctivae and lids unremarkable. Cardiovascular: Regular rhythm.  Lungs: Normal work of breathing. Neurologic: No focal deficits.   Lab Results  Component Value Date   CREATININE 0.63 05/25/2020   BUN 10 05/25/2020   NA 138 05/25/2020   K 4.3 05/25/2020   CL 105 05/25/2020   CO2 20 05/25/2020   Lab Results  Component Value Date   ALT  18 05/25/2020   AST 15 05/25/2020   ALKPHOS 87 05/25/2020   BILITOT 0.2 05/25/2020   Lab Results  Component Value Date   HGBA1C 6.2 (H) 10/21/2019   HGBA1C 5.8 (H) 09/25/2018   Lab Results  Component  Value Date   INSULIN 59.7 (H) 10/21/2019   Lab Results  Component Value Date   TSH 0.495 09/25/2018   Lab Results  Component Value Date   CHOL 153 10/21/2019   HDL 55 10/21/2019   LDLCALC 77 10/21/2019   TRIG 119 10/21/2019   Lab Results  Component Value Date   WBC 7.6 05/25/2020   HGB 13.2 05/25/2020   HCT 40.0 05/25/2020   MCV 80 05/25/2020   PLT 344 05/25/2020   No results found for: IRON, TIBC, FERRITIN  Attestation Statements:   Reviewed by clinician on day of visit: allergies, medications, problem list, medical history, surgical history, family history, social history, and previous encounter notes.  Fernanda Drum, am acting as Energy manager for [employer], DO   I have reviewed the above documentation for accuracy and completeness, and I agree with the above. Corinna Capra, DO

## 2020-06-14 MED FILL — TRI-LO-ESTARYLLA 0.18/0.215: 0.18/0.215/ | 28 days supply | Qty: 28 | Fill #1

## 2020-06-14 MED FILL — VICTOZA 18 MG/3 ML INJECT P: 18 | 30 days supply | Qty: 9 | Fill #0

## 2020-06-23 ENCOUNTER — Ambulatory Visit (INDEPENDENT_AMBULATORY_CARE_PROVIDER_SITE_OTHER): Payer: Self-pay | Admitting: Bariatrics

## 2020-07-14 ENCOUNTER — Other Ambulatory Visit (INDEPENDENT_AMBULATORY_CARE_PROVIDER_SITE_OTHER): Payer: Self-pay | Admitting: Family Medicine

## 2020-07-14 ENCOUNTER — Other Ambulatory Visit (INDEPENDENT_AMBULATORY_CARE_PROVIDER_SITE_OTHER): Payer: Self-pay | Admitting: Bariatrics

## 2020-07-14 DIAGNOSIS — R7303 Prediabetes: Secondary | ICD-10-CM

## 2020-07-14 MED FILL — TRI-LO-ESTARYLLA 0.18/0.215: 0.18/0.215/ | 28 days supply | Qty: 28 | Fill #2

## 2020-07-14 MED FILL — VIT D2 1.25 MG (50,000 UNIT: 1.25 MG | 28 days supply | Qty: 4 | Fill #0

## 2020-07-14 MED FILL — VICTOZA 18 MG/3 ML INJECT P: 18 | 30 days supply | Qty: 9 | Fill #0

## 2020-07-14 MED FILL — METFORMIN HCL 500 MG TABS: 500 | 30 days supply | Qty: 60 | Fill #0

## 2020-09-12 ENCOUNTER — Telehealth: Payer: Self-pay

## 2020-09-12 NOTE — Telephone Encounter (Signed)
Patient dropped off information for Financial Counseling.

## 2021-01-07 ENCOUNTER — Other Ambulatory Visit: Payer: Self-pay

## 2021-07-11 ENCOUNTER — Ambulatory Visit (INDEPENDENT_AMBULATORY_CARE_PROVIDER_SITE_OTHER): Payer: Self-pay | Admitting: Family Medicine

## 2021-07-11 ENCOUNTER — Encounter: Payer: Self-pay | Admitting: Family Medicine

## 2021-07-11 ENCOUNTER — Other Ambulatory Visit: Payer: Self-pay

## 2021-07-11 VITALS — BP 121/80 | HR 85 | Temp 98.0°F | Resp 16 | Ht 65.5 in | Wt 311.2 lb

## 2021-07-11 DIAGNOSIS — Z789 Other specified health status: Secondary | ICD-10-CM

## 2021-07-11 DIAGNOSIS — Z6841 Body Mass Index (BMI) 40.0 and over, adult: Secondary | ICD-10-CM

## 2021-07-11 DIAGNOSIS — Z7689 Persons encountering health services in other specified circumstances: Secondary | ICD-10-CM

## 2021-07-11 DIAGNOSIS — Z30011 Encounter for initial prescription of contraceptive pills: Secondary | ICD-10-CM

## 2021-07-11 MED ORDER — METFORMIN HCL 1000 MG PO TABS: 1000.0000 mg | ORAL_TABLET | Freq: Two times a day (BID) | ORAL | 3 refills | Status: DC

## 2021-07-11 MED ORDER — NORGESTIM-ETH ESTRAD TRIPHASIC 0.18/0.215/0.25 MG-25 MCG PO TABS: 1.0000 | ORAL_TABLET | Freq: Every day | ORAL | 2 refills | Status: DC

## 2021-07-11 NOTE — Progress Notes (Signed)
Patient is here to talk about birth control with provider.

## 2021-07-12 ENCOUNTER — Encounter: Payer: Self-pay | Admitting: Family Medicine

## 2021-07-12 NOTE — Progress Notes (Signed)
New Patient Office Visit  Subjective:  Patient ID: Morgan Dudley, female    DOB: 26-Jan-1986  Age: 35 y.o. MRN: [id number]  CC:  Chief Complaint  Patient presents with   Contraception    HPI Morgan Dudley presents for to establish care. Patient reports that she would like to restart birth control. She also would like to know if she should restart metformin for weight control. She denies acute complaints.   Past Medical History:  Diagnosis Date   GERD (gastroesophageal reflux disease)    Obesity    Pre-diabetes     No past surgical history on file.  Family History  Problem Relation Age of Onset   Obesity Mother        Prediabetes -no medication    Diabetes Mother    High blood pressure Mother     Social History   Socioeconomic History   Marital status: Single    Spouse name: Not on file   Number of children: Not on file   Years of education: Not on file   Highest education level: Not on file  Occupational History   Occupation: CNA, finished Nursing School  Tobacco Use   Smoking status: Never   Smokeless tobacco: Never  Substance and Sexual Activity   Alcohol use: Not Currently   Drug use: Never   Sexual activity: Not on file  Other Topics Concern   Not on file  Social History Narrative   Not on file   Social Determinants of Health   Financial Resource Strain: Not on file  Food Insecurity: Not on file  Transportation Needs: Not on file  Physical Activity: Not on file  Stress: Not on file  Social Connections: Not on file  Intimate Partner Violence: Not on file    ROS Review of Systems  All other systems reviewed and are negative.  Objective:   Today's Vitals: BP 121/80 (BP Location: Right Arm, Patient Position: Sitting, Cuff Size: Large)   Pulse 85   Temp 98 F (36.7 C) (Oral)   Resp 16   Ht 5' 5.5" (1.664 m)   Wt (!) 311 lb 3.2 oz (141.2 kg)   SpO2 97%   BMI 51.00 kg/m   Physical Exam Vitals and nursing note reviewed.   Constitutional:      General: She is not in acute distress.    Appearance: She is obese.  Cardiovascular:     Rate and Rhythm: Normal rate and regular rhythm.  Pulmonary:     Effort: Pulmonary effort is normal.     Breath sounds: Normal breath sounds.  Abdominal:     Palpations: Abdomen is soft.     Tenderness: There is no abdominal tenderness.  Neurological:     General: No focal deficit present.     Mental Status: She is alert and oriented to person, place, and time.    Assessment & Plan:   1. Encounter for initial prescription of contraceptive pills OCP prescribed. Will monitor - Norgestimate-Ethinyl Estradiol Triphasic (TRI-LO-SPRINTEC) 0.18/0.215/0.25 MG-25 MCG tab; Take 1 tablet by mouth daily.  Dispense: 28 tablet; Refill: 2  2. Class 3 severe obesity due to excess calories without serious comorbidity with body mass index (BMI) of 50.0 to 59.9 in adult [facility]) Discussed dietary and activity options. Compliance discussed. Metformin refilled.   3. Encounter to establish care     Outpatient Encounter Medications as of 07/11/2021  Medication Sig   metFORMIN (GLUCOPHAGE) 1000 MG tablet Take 1 tablet (1,000 mg total)  by mouth 2 (two) times daily with a meal.   erythromycin ophthalmic ointment Place 1 application into the left eye at bedtime. (Patient not taking: Reported on 07/11/2021)   liraglutide (VICTOZA) 18 MG/3ML SOPN INJECT 0.3 MLS (1.8 MG TOTAL) INTO THE SKIN DAILY. (Patient not taking: Reported on 07/11/2021)   metroNIDAZOLE (FLAGYL) 500 MG tablet Take 1 tablet (500 mg total) by mouth 2 (two) times daily. (Patient not taking: Reported on 07/11/2021)   Norgestimate-Ethinyl Estradiol Triphasic (TRI-LO-SPRINTEC) 0.18/0.215/0.25 MG-25 MCG tab Take 1 tablet by mouth daily.   TRUEPLUS 5-BEVEL PEN NEEDLES 32G X 4 MM MISC TAKE 2 (TWO) TIMES DAILY. (Patient not taking: Reported on 07/11/2021)   [DISCONTINUED] metFORMIN (GLUCOPHAGE) 500 MG tablet Take one pill po with breakfast and  on pill po with lunch (Patient not taking: Reported on 07/11/2021)   [DISCONTINUED] Norgestimate-Ethinyl Estradiol Triphasic (TRI-LO-SPRINTEC) 0.18/0.215/0.25 MG-25 MCG tab Take 1 tablet by mouth daily. (Patient not taking: Reported on 07/11/2021)   No facility-administered encounter medications on file as of 07/11/2021.    Follow-up: Return in about 3 months (around 10/11/2021) for follow up.   Tommie Raymond, MD

## 2021-10-11 ENCOUNTER — Ambulatory Visit: Payer: Self-pay | Admitting: Family Medicine

## 2021-10-15 ENCOUNTER — Other Ambulatory Visit: Payer: Self-pay | Admitting: Family Medicine

## 2021-10-15 DIAGNOSIS — Z30011 Encounter for initial prescription of contraceptive pills: Secondary | ICD-10-CM

## 2021-10-16 ENCOUNTER — Telehealth: Payer: Self-pay | Admitting: Family Medicine

## 2021-10-16 NOTE — Telephone Encounter (Signed)
°  Checking on status of year prescription of :   Norgestimate-Ethinyl Estradiol Triphasic (TRI-LO-SPRINTEC) 0.18/0.215/0.25 MG-25 MCG tab [[id number]]    Pharmacy  [id number] - [place] ([place]), [place] - [address]  [address] [place] ([place]) [place] [address]  Phone:  [phone]  Fax:  [phone]

## 2021-10-19 NOTE — Telephone Encounter (Signed)
Per provider will give more after next visit

## 2021-11-07 ENCOUNTER — Other Ambulatory Visit: Payer: Self-pay | Admitting: Family Medicine

## 2021-11-07 ENCOUNTER — Telehealth: Payer: Self-pay | Admitting: Family Medicine

## 2021-11-07 ENCOUNTER — Other Ambulatory Visit: Payer: Self-pay | Admitting: *Deleted

## 2021-11-07 DIAGNOSIS — Z30011 Encounter for initial prescription of contraceptive pills: Secondary | ICD-10-CM

## 2021-11-07 MED ORDER — NORGESTIM-ETH ESTRAD TRIPHASIC 0.18/0.215/0.25 MG-25 MCG PO TABS: 1.0000 | ORAL_TABLET | Freq: Every day | ORAL | 0 refills | Status: DC

## 2021-11-07 NOTE — Telephone Encounter (Signed)
TRI-LO-MILI 0.18/0.215/0.25 MG-25 MCG tab [id number]    [place]), [place] - [address]  [address] [facility] [place] [address]  Phone:  [phone]  Fax:  [phone]

## 2021-11-08 ENCOUNTER — Ambulatory Visit: Payer: Self-pay | Admitting: Family Medicine

## 2021-11-21 ENCOUNTER — Ambulatory Visit: Payer: Self-pay | Admitting: Family Medicine

## 2021-12-07 ENCOUNTER — Other Ambulatory Visit: Payer: Self-pay | Admitting: Family Medicine

## 2021-12-07 DIAGNOSIS — Z30011 Encounter for initial prescription of contraceptive pills: Secondary | ICD-10-CM

## 2022-01-05 ENCOUNTER — Other Ambulatory Visit: Payer: Self-pay | Admitting: Family Medicine

## 2022-01-05 DIAGNOSIS — Z30011 Encounter for initial prescription of contraceptive pills: Secondary | ICD-10-CM

## 2022-02-03 ENCOUNTER — Other Ambulatory Visit: Payer: Self-pay | Admitting: Family Medicine

## 2022-02-03 DIAGNOSIS — Z30011 Encounter for initial prescription of contraceptive pills: Secondary | ICD-10-CM

## 2022-08-11 ENCOUNTER — Other Ambulatory Visit: Payer: Self-pay | Admitting: Family Medicine

## 2022-08-11 DIAGNOSIS — Z30011 Encounter for initial prescription of contraceptive pills: Secondary | ICD-10-CM

## 2022-08-13 NOTE — Telephone Encounter (Signed)
Courtesy refill. Patient will need an office visit for further refills. Requested Prescriptions  Pending Prescriptions Disp Refills   TRI-LO-MILI 0.18/0.215/0.25 MG-25 MCG tab [Pharmacy Med Name: Tri-Lo-Mili 0.18/0.215/0.25 MG-25 MCG Oral Tablet] 28 tablet 0    Sig: Take 1 tablet by mouth once daily     OB/GYN:  Contraceptives Failed - 08/11/2022  9:42 AM      Failed - Valid encounter within last 12 months    Recent Outpatient Visits           1 year ago Encounter for initial prescription of contraceptive pills   Primary Care at [facility], MD   2 years ago Annual physical exam   Primary Care at [facility], Bayard Beaver, MD   2 years ago Encounter to establish care   Primary Care at [facility], Bayard Beaver, MD   3 years ago Hordeolum externum of left lower eyelid   Primary Care at [facility], Dalbert Batman, MD   3 years ago Family planning counseling   Primary Care at [facility], Dalbert Batman, MD              Passed - Last BP in normal range    BP Readings from Last 1 Encounters:  07/11/21 121/80         Passed - Patient is not a smoker

## 2022-08-13 NOTE — Telephone Encounter (Signed)
Called pt voice mail box not set up.  

## 2022-09-08 ENCOUNTER — Other Ambulatory Visit: Payer: Self-pay | Admitting: Family Medicine

## 2022-09-08 DIAGNOSIS — Z30011 Encounter for initial prescription of contraceptive pills: Secondary | ICD-10-CM

## 2022-09-17 NOTE — Telephone Encounter (Signed)
Pt called and scheduled appt to get refill for TRI-LO-MILI 0.18/0.215/0.25 MG-25 MCG tab   Pt also needs refill for metFORMIN (GLUCOPHAGE) 1000 MG tablet [[id number]]  [id number] - [place] ([place]), [place] - [address]

## 2022-09-17 NOTE — Addendum Note (Signed)
Addended by: Elby Beck F on: 09/17/2022 04:20 PM   Modules accepted: Orders

## 2022-09-18 MED ORDER — NORGESTIM-ETH ESTRAD TRIPHASIC 0.18/0.215/0.25 MG-25 MCG PO TABS: 1.0000 | ORAL_TABLET | Freq: Every day | ORAL | 0 refills | Status: DC

## 2022-09-18 NOTE — Telephone Encounter (Signed)
Requested medication (s) are due for refill today: Yes  Requested medication (s) are on the active medication list: Yes  Last refill:  07/11/22  Future visit scheduled: Yes  Notes to clinic:  Prescription has expired.    Requested Prescriptions  Pending Prescriptions Disp Refills   metFORMIN (GLUCOPHAGE) 1000 MG tablet 180 tablet 3    Sig: Take 1 tablet (1,000 mg total) by mouth 2 (two) times daily with a meal.     Endocrinology:  Diabetes - Biguanides Failed - 09/17/2022  4:20 PM      Failed - Cr in normal range and within 360 days    Creatinine, Ser  Date Value Ref Range Status  05/25/2020 0.63 0.57 - 1.00 mg/dL Final         Failed - HBA1C is between 0 and 7.9 and within 180 days    Hgb A1c MFr Bld  Date Value Ref Range Status  10/21/2019 6.2 (H) 4.8 - 5.6 % Final    Comment:             Prediabetes: 5.7 - 6.4          Diabetes: >6.4          Glycemic control for adults with diabetes: <7.0          Failed - eGFR in normal range and within 360 days    GFR calc Af Amer  Date Value Ref Range Status  05/25/2020 134 >59 mL/min/1.73 Final    Comment:    **[facility] currently reports eGFR in compliance with the current**   recommendations of the [employer]. [facility] will   update reporting as new guidelines are published from the NKF-ASN   Task force.    GFR calc non Af Amer  Date Value Ref Range Status  05/25/2020 117 >59 mL/min/1.73 Final         Failed - B12 Level in normal range and within 720 days    No results found for: "VITAMINB12"       Failed - Valid encounter within last 6 months    Recent Outpatient Visits           1 year ago Encounter for initial prescription of contraceptive pills   Primary Care at [facility], MD   2 years ago Annual physical exam   Primary Care at [facility], Bayard Beaver, MD   2 years ago Encounter to establish care   Primary Care at [facility], Bayard Beaver, MD    3 years ago Hordeolum externum of left lower eyelid   Primary Care at [facility], Dalbert Batman, MD   3 years ago Family planning counseling   Primary Care at [facility], MD       Future Appointments             In 3 weeks Dorna Mai, MD Primary Care at [facility]            Failed - CBC within normal limits and completed in the last 12 months    WBC  Date Value Ref Range Status  05/25/2020 7.6 3.4 - 10.8 x10E3/uL Final  11/03/2009 9.9 4.0 - 10.5 K/uL Final   RBC  Date Value Ref Range Status  05/25/2020 5.01 3.77 - 5.28 x10E6/uL Final  11/03/2009 3.82 (L) 3.87 - 5.11 MIL/uL Final   Hemoglobin  Date Value Ref Range Status  05/25/2020 13.2 11.1 - 15.9 g/dL Final   Hematocrit  Date Value Ref Range Status  05/25/2020 40.0 34.0 - 46.6 % Final   MCHC  Date Value Ref Range Status  05/25/2020 33.0 31.5 - 35.7 g/dL Final  11/03/2009 33.0 30.0 - 36.0 g/dL Final   [facility]  Date Value Ref Range Status  05/25/2020 26.3 (L) 26.6 - 33.0 pg Final   MCV  Date Value Ref Range Status  05/25/2020 80 79 - 97 fL Final   No results found for: "PLTCOUNTKUC", "LABPLAT", "POCPLA" RDW  Date Value Ref Range Status  05/25/2020 13.3 11.7 - 15.4 % Final         Signed Prescriptions Disp Refills   Norgestimate-Ethinyl Estradiol Triphasic (TRI-LO-MILI) 0.18/0.215/0.25 MG-25 MCG tab 28 tablet 0    Sig: Take 1 tablet by mouth daily.     OB/GYN:  Contraceptives Failed - 09/17/2022  4:20 PM      Failed - Valid encounter within last 12 months    Recent Outpatient Visits           1 year ago Encounter for initial prescription of contraceptive pills   Primary Care at [facility], MD   2 years ago Annual physical exam   Primary Care at [facility], Bayard Beaver, MD   2 years ago Encounter to establish care   Primary Care at [facility], Bayard Beaver, MD   3 years ago Hordeolum externum of left lower  eyelid   Primary Care at [facility], Dalbert Batman, MD   3 years ago Family planning counseling   Primary Care at [facility], Dalbert Batman, MD       Future Appointments             In 3 weeks Dorna Mai, MD Primary Care at [place] BP in normal range    BP Readings from Last 1 Encounters:  07/11/21 121/80         Passed - Patient is not a smoker      Refused Prescriptions Disp Refills   TRI-LO-MILI 0.18/0.215/0.25 MG-25 MCG tab [Pharmacy Med Name: Tri-Lo-Mili 0.18/0.215/0.25 MG-25 MCG Oral Tablet] 28 tablet 0    Sig: TAKE 1 TABLET BY MOUTH ONCE DAILY . APPOINTMENT REQUIRED FOR FUTURE REFILLS     OB/GYN:  Contraceptives Failed - 09/17/2022  4:20 PM      Failed - Valid encounter within last 12 months    Recent Outpatient Visits           1 year ago Encounter for initial prescription of contraceptive pills   Primary Care at [facility], MD   2 years ago Annual physical exam   Primary Care at [facility], Bayard Beaver, MD   2 years ago Encounter to establish care   Primary Care at [facility], Bayard Beaver, MD   3 years ago Hordeolum externum of left lower eyelid   Primary Care at [facility], Dalbert Batman, MD   3 years ago Family planning counseling   Primary Care at [facility], Dalbert Batman, MD       Future Appointments             In 3 weeks Dorna Mai, MD Primary Care at [place] BP in normal range    BP Readings from Last 1 Encounters:  07/11/21 121/80  Passed - Patient is not a smoker

## 2022-10-10 ENCOUNTER — Other Ambulatory Visit: Payer: Self-pay | Admitting: Family Medicine

## 2022-10-10 ENCOUNTER — Ambulatory Visit: Payer: Self-pay | Admitting: Family Medicine

## 2022-10-10 DIAGNOSIS — Z30011 Encounter for initial prescription of contraceptive pills: Secondary | ICD-10-CM

## 2022-10-12 ENCOUNTER — Other Ambulatory Visit: Payer: Self-pay | Admitting: Family Medicine

## 2022-10-12 DIAGNOSIS — Z30011 Encounter for initial prescription of contraceptive pills: Secondary | ICD-10-CM

## 2022-11-05 ENCOUNTER — Ambulatory Visit: Payer: Self-pay | Admitting: Family Medicine

## 2022-11-06 ENCOUNTER — Other Ambulatory Visit: Payer: Self-pay | Admitting: Family Medicine

## 2022-11-06 DIAGNOSIS — Z30011 Encounter for initial prescription of contraceptive pills: Secondary | ICD-10-CM

## 2022-11-12 NOTE — Progress Notes (Deleted)
Patient ID: Morgan Dudley, female    DOB: 07-Nov-1985  MRN: [id number]  CC: Medication Refill  Subjective: Morgan Dudley is a 37 y.o. female who presents for medication refill.   Her concerns today include:  Morgan Dudley patient  Patient Active Problem List   Diagnosis Date Noted   Atypical squamous cells cannot exclude high grade squamous intraepithelial lesion on cytologic smear of cervix (ASC-H) 05/30/2020   Vitamin D insufficiency 10/22/2019   Prediabetes 10/22/2019   Morbid obesity ([place]) 06/02/2019     Current Outpatient Medications on File Prior to Visit  Medication Sig Dispense Refill   erythromycin ophthalmic ointment Place 1 application into the left eye at bedtime. (Patient not taking: Reported on 07/11/2021) 3.5 g 0   liraglutide (VICTOZA) 18 MG/3ML SOPN INJECT 0.3 MLS (1.8 MG TOTAL) INTO THE SKIN DAILY. (Patient not taking: Reported on 07/11/2021) 9 mL 0   metFORMIN (GLUCOPHAGE) 1000 MG tablet Take 1 tablet (1,000 mg total) by mouth 2 (two) times daily with a meal. 180 tablet 3   metroNIDAZOLE (FLAGYL) 500 MG tablet Take 1 tablet (500 mg total) by mouth 2 (two) times daily. (Patient not taking: Reported on 07/11/2021) 14 tablet 0   TRI-LO-MILI 0.18/0.215/0.25 MG-25 MCG tab TAKE 1 TABLET BY MOUTH ONCE DAILY. PT NEEDS TO KEEP UPCOMING APPOINTMENT. 28 tablet 0   TRUEPLUS 5-BEVEL PEN NEEDLES 32G X 4 MM MISC TAKE 2 (TWO) TIMES DAILY. (Patient not taking: Reported on 07/11/2021)     No current facility-administered medications on file prior to visit.    No Known Allergies  Social History   Socioeconomic History   Marital status: Single    Spouse name: Not on file   Number of children: Not on file   Years of education: Not on file   Highest education level: Not on file  Occupational History   Occupation: CNA, finished Nursing School  Tobacco Use   Smoking status: Never   Smokeless tobacco: Never  Substance and Sexual Activity   Alcohol use: Not Currently    Drug use: Never   Sexual activity: Not on file  Other Topics Concern   Not on file  Social History Narrative   Not on file   Social Determinants of Health   Financial Resource Strain: Not on file  Food Insecurity: Not on file  Transportation Needs: Not on file  Physical Activity: Not on file  Stress: Not on file  Social Connections: Not on file  Intimate Partner Violence: Not on file    Family History  Problem Relation Age of Onset   Obesity Mother        Prediabetes -no medication    Diabetes Mother    High blood pressure Mother     No past surgical history on file.  ROS: Review of Systems Negative except as stated above  PHYSICAL EXAM: There were no vitals taken for this visit.  Physical Exam  {female adult master:310786} {female adult master:310785}     Latest Ref Rng & Units 05/25/2020    9:10 AM 10/21/2019   11:54 AM  CMP  Glucose 65 - 99 mg/dL 111  113   BUN 6 - 20 mg/dL 10  11   Creatinine 0.57 - 1.00 mg/dL 0.63  0.64   Sodium 134 - 144 mmol/L 138  137   Potassium 3.5 - 5.2 mmol/L 4.3  5.1   Chloride 96 - 106 mmol/L 105  101   CO2 20 - 29 mmol/L 20  23  Calcium 8.7 - 10.2 mg/dL 9.2  9.4   Total Protein 6.0 - 8.5 g/dL 7.0  6.6   Total Bilirubin 0.0 - 1.2 mg/dL 0.2  0.3   Alkaline Phos 48 - 121 IU/L 87  86   AST 0 - 40 IU/L 15  17   ALT 0 - 32 IU/L 18  16    Lipid Panel     Component Value Date/Time   CHOL 153 10/21/2019 1154   TRIG 119 10/21/2019 1154   HDL 55 10/21/2019 1154   LDLCALC 77 10/21/2019 1154    CBC    Component Value Date/Time   WBC 7.6 05/25/2020 0910   WBC 9.9 11/03/2009 0520   RBC 5.01 05/25/2020 0910   RBC 3.82 (L) 11/03/2009 0520   HGB 13.2 05/25/2020 0910   HCT 40.0 05/25/2020 0910   PLT 344 05/25/2020 0910   MCV 80 05/25/2020 0910   MCH 26.3 (L) 05/25/2020 0910   MCHC 33.0 05/25/2020 0910   MCHC 33.0 11/03/2009 0520   RDW 13.3 05/25/2020 0910   LYMPHSABS 1.5 05/25/2020 0910   EOSABS 0.3 05/25/2020 0910    BASOSABS 0.0 05/25/2020 0910    ASSESSMENT AND PLAN:  There are no diagnoses linked to this encounter.   Patient was given the opportunity to ask questions.  Patient verbalized understanding of the plan and was able to repeat key elements of the plan. Patient was given clear instructions to go to Emergency Department or return to medical center if symptoms don't improve, worsen, or new problems develop.The patient verbalized understanding.   No orders of the defined types were placed in this encounter.    Requested Prescriptions    No prescriptions requested or ordered in this encounter    No follow-ups on file.  Morgan Herter, NP

## 2022-11-13 ENCOUNTER — Ambulatory Visit: Payer: Self-pay | Admitting: Family

## 2022-11-20 NOTE — Progress Notes (Deleted)
Patient ID: KATIMA PASHIA, female    DOB: 04-02-1986  MRN: [id number]  CC: Medication Refill  Subjective: Morgan Dudley is a 37 y.o. female who presents for medication refill.   Her concerns today include:  [name] patient  Patient Active Problem List   Diagnosis Date Noted   Atypical squamous cells cannot exclude high grade squamous intraepithelial lesion on cytologic smear of cervix (ASC-H) 05/30/2020   Vitamin D insufficiency 10/22/2019   Prediabetes 10/22/2019   Morbid obesity ([place]) 06/02/2019     Current Outpatient Medications on File Prior to Visit  Medication Sig Dispense Refill   erythromycin ophthalmic ointment Place 1 application into the left eye at bedtime. (Patient not taking: Reported on 07/11/2021) 3.5 g 0   liraglutide (VICTOZA) 18 MG/3ML SOPN INJECT 0.3 MLS (1.8 MG TOTAL) INTO THE SKIN DAILY. (Patient not taking: Reported on 07/11/2021) 9 mL 0   metFORMIN (GLUCOPHAGE) 1000 MG tablet Take 1 tablet (1,000 mg total) by mouth 2 (two) times daily with a meal. 180 tablet 3   metroNIDAZOLE (FLAGYL) 500 MG tablet Take 1 tablet (500 mg total) by mouth 2 (two) times daily. (Patient not taking: Reported on 07/11/2021) 14 tablet 0   TRI-LO-MILI 0.18/0.215/0.25 MG-25 MCG tab TAKE 1 TABLET BY MOUTH ONCE DAILY. PT NEEDS TO KEEP UPCOMING APPOINTMENT. 28 tablet 0   TRUEPLUS 5-BEVEL PEN NEEDLES 32G X 4 MM MISC TAKE 2 (TWO) TIMES DAILY. (Patient not taking: Reported on 07/11/2021)     No current facility-administered medications on file prior to visit.    No Known Allergies  Social History   Socioeconomic History   Marital status: Single    Spouse name: Not on file   Number of children: Not on file   Years of education: Not on file   Highest education level: Not on file  Occupational History   Occupation: CNA, finished Nursing School  Tobacco Use   Smoking status: Never   Smokeless tobacco: Never  Substance and Sexual Activity   Alcohol use: Not Currently    Drug use: Never   Sexual activity: Not on file  Other Topics Concern   Not on file  Social History Narrative   Not on file   Social Determinants of Health   Financial Resource Strain: Not on file  Food Insecurity: Not on file  Transportation Needs: Not on file  Physical Activity: Not on file  Stress: Not on file  Social Connections: Not on file  Intimate Partner Violence: Not on file    Family History  Problem Relation Age of Onset   Obesity Mother        Prediabetes -no medication    Diabetes Mother    High blood pressure Mother     No past surgical history on file.  ROS: Review of Systems Negative except as stated above  PHYSICAL EXAM: There were no vitals taken for this visit.  Physical Exam  {female adult master:310786} {female adult master:310785}     Latest Ref Rng & Units 05/25/2020    9:10 AM 10/21/2019   11:54 AM  CMP  Glucose 65 - 99 mg/dL 111  113   BUN 6 - 20 mg/dL 10  11   Creatinine 0.57 - 1.00 mg/dL 0.63  0.64   Sodium 134 - 144 mmol/L 138  137   Potassium 3.5 - 5.2 mmol/L 4.3  5.1   Chloride 96 - 106 mmol/L 105  101   CO2 20 - 29 mmol/L 20  23  Calcium 8.7 - 10.2 mg/dL 9.2  9.4   Total Protein 6.0 - 8.5 g/dL 7.0  6.6   Total Bilirubin 0.0 - 1.2 mg/dL 0.2  0.3   Alkaline Phos 48 - 121 IU/L 87  86   AST 0 - 40 IU/L 15  17   ALT 0 - 32 IU/L 18  16    Lipid Panel     Component Value Date/Time   CHOL 153 10/21/2019 1154   TRIG 119 10/21/2019 1154   HDL 55 10/21/2019 1154   LDLCALC 77 10/21/2019 1154    CBC    Component Value Date/Time   WBC 7.6 05/25/2020 0910   WBC 9.9 11/03/2009 0520   RBC 5.01 05/25/2020 0910   RBC 3.82 (L) 11/03/2009 0520   HGB 13.2 05/25/2020 0910   HCT 40.0 05/25/2020 0910   PLT 344 05/25/2020 0910   MCV 80 05/25/2020 0910   MCH 26.3 (L) 05/25/2020 0910   MCHC 33.0 05/25/2020 0910   MCHC 33.0 11/03/2009 0520   RDW 13.3 05/25/2020 0910   LYMPHSABS 1.5 05/25/2020 0910   EOSABS 0.3 05/25/2020 0910    BASOSABS 0.0 05/25/2020 0910    ASSESSMENT AND PLAN:  There are no diagnoses linked to this encounter.   Patient was given the opportunity to ask questions.  Patient verbalized understanding of the plan and was able to repeat key elements of the plan. Patient was given clear instructions to go to Emergency Department or return to medical center if symptoms don't improve, worsen, or new problems develop.The patient verbalized understanding.   No orders of the defined types were placed in this encounter.    Requested Prescriptions    No prescriptions requested or ordered in this encounter    No follow-ups on file.  Camillia Herter, NP

## 2022-11-23 ENCOUNTER — Ambulatory Visit: Payer: Self-pay | Admitting: Family

## 2022-11-27 ENCOUNTER — Ambulatory Visit: Payer: Self-pay | Admitting: Family Medicine

## 2022-11-28 NOTE — Progress Notes (Signed)
Patient ID: Morgan Dudley, female    DOB: 1986-05-25  MRN: [id number]  CC: Medication Refill  Subjective: Morgan Dudley is a 37 y.o. female who presents for medication refill.   Her concerns today include:  Presents for refills of Metformin for prediabetes management. Also, needs refills of birth control pills. No further issues/concerns for discussion today.   Patient Active Problem List   Diagnosis Date Noted   Atypical squamous cells cannot exclude high grade squamous intraepithelial lesion on cytologic smear of cervix (ASC-H) 05/30/2020   Vitamin D insufficiency 10/22/2019   Prediabetes 10/22/2019   Morbid obesity ([place]) 06/02/2019     No current outpatient medications on file prior to visit.   No current facility-administered medications on file prior to visit.    No Known Allergies  Social History   Socioeconomic History   Marital status: Single    Spouse name: Not on file   Number of children: Not on file   Years of education: Not on file   Highest education level: Not on file  Occupational History   Occupation: CNA, finished Nursing School  Tobacco Use   Smoking status: Never    Passive exposure: Never   Smokeless tobacco: Never  Substance and Sexual Activity   Alcohol use: Not Currently   Drug use: Never   Sexual activity: Not on file  Other Topics Concern   Not on file  Social History Narrative   Not on file   Social Determinants of Health   Financial Resource Strain: Not on file  Food Insecurity: Not on file  Transportation Needs: Not on file  Physical Activity: Not on file  Stress: Not on file  Social Connections: Not on file  Intimate Partner Violence: Not on file    Family History  Problem Relation Age of Onset   Obesity Mother        Prediabetes -no medication    Diabetes Mother    High blood pressure Mother     No past surgical history on file.  ROS: Review of Systems Negative except as stated above  PHYSICAL EXAM: BP  114/76 (BP Location: Left Arm, Patient Position: Sitting, Cuff Size: Large)   Pulse 92   Temp 98.3 F (36.8 C)   Resp 16   Ht 5' 5.51" (1.664 m)   Wt (!) 303 lb (137.4 kg)   SpO2 97%   BMI 49.64 kg/m   Physical Exam HENT:     Head: Normocephalic and atraumatic.  Eyes:     Extraocular Movements: Extraocular movements intact.     Conjunctiva/sclera: Conjunctivae normal.     Pupils: Pupils are equal, round, and reactive to light.  Cardiovascular:     Rate and Rhythm: Normal rate and regular rhythm.     Pulses: Normal pulses.     Heart sounds: Normal heart sounds.  Pulmonary:     Effort: Pulmonary effort is normal.     Breath sounds: Normal breath sounds.  Musculoskeletal:     Cervical back: Normal range of motion and neck supple.  Neurological:     General: No focal deficit present.     Mental Status: She is alert and oriented to person, place, and time.  Psychiatric:        Mood and Affect: Mood normal.        Behavior: Behavior normal.    Results for orders placed or performed in visit on 12/05/22  POCT glycosylated hemoglobin (Hb A1C)  Result Value Ref Range  Hemoglobin A1C     HbA1c POC (<> result, manual entry)     HbA1c, POC (prediabetic range) 6.0 5.7 - 6.4 %   HbA1c, POC (controlled diabetic range)    POCT urine pregnancy  Result Value Ref Range   Preg Test, Ur Negative Negative    ASSESSMENT AND PLAN: 1. Encounter for surveillance of contraceptive pills - Urine pregnancy negative.  - Continue Tri-Lo-Mili as prescribed.  - Follow-up with primary provider as scheduled. - POCT urine pregnancy - Norgestimate-Ethinyl Estradiol Triphasic (TRI-LO-MILI) 0.18/0.215/0.25 MG-25 MCG tab; TAKE 1 TABLET BY MOUTH ONCE DAILY.  Dispense: 28 tablet; Refill: 11  2. Prediabetes - Hemoglobin A1c remaining at prediabetes at 6.0%.  - Continue Metformin as prescribed.  - Discussed the importance of healthy eating habits, low-carbohydrate diet, low-sugar diet, regular aerobic  exercise (at least 150 minutes a week as tolerated) and medication compliance to achieve or maintain control of prediabetes. - Follow-up with primary provider in 6 months or sooner if needed.  - POCT glycosylated hemoglobin (Hb A1C) - metFORMIN (GLUCOPHAGE) 1000 MG tablet; Take 1 tablet (1,000 mg total) by mouth 2 (two) times daily with a meal.  Dispense: 60 tablet; Refill: 2   Patient was given the opportunity to ask questions.  Patient verbalized understanding of the plan and was able to repeat key elements of the plan. Patient was given clear instructions to go to Emergency Department or return to medical center if symptoms don't improve, worsen, or new problems develop.The patient verbalized understanding.   Orders Placed This Encounter  Procedures   POCT glycosylated hemoglobin (Hb A1C)   POCT urine pregnancy     Requested Prescriptions   Signed Prescriptions Disp Refills   metFORMIN (GLUCOPHAGE) 1000 MG tablet 60 tablet 2    Sig: Take 1 tablet (1,000 mg total) by mouth 2 (two) times daily with a meal.   Norgestimate-Ethinyl Estradiol Triphasic (TRI-LO-MILI) 0.18/0.215/0.25 MG-25 MCG tab 28 tablet 11    Sig: TAKE 1 TABLET BY MOUTH ONCE DAILY.    Return in 6 months (on 06/05/2023) for Follow-Up or next available with Dorna Mai, MD.  Camillia Herter, NP

## 2022-12-05 ENCOUNTER — Encounter: Payer: Self-pay | Admitting: Family

## 2022-12-05 ENCOUNTER — Ambulatory Visit (INDEPENDENT_AMBULATORY_CARE_PROVIDER_SITE_OTHER): Payer: Self-pay | Admitting: Family

## 2022-12-05 VITALS — BP 114/76 | HR 92 | Temp 98.3°F | Resp 16 | Ht 65.51 in | Wt 303.0 lb

## 2022-12-05 DIAGNOSIS — R7303 Prediabetes: Secondary | ICD-10-CM

## 2022-12-05 DIAGNOSIS — Z3041 Encounter for surveillance of contraceptive pills: Secondary | ICD-10-CM

## 2022-12-05 DIAGNOSIS — Z3202 Encounter for pregnancy test, result negative: Secondary | ICD-10-CM

## 2022-12-05 LAB — POCT URINE PREGNANCY: Preg Test, Ur: NEGATIVE

## 2022-12-05 LAB — POCT GLYCOSYLATED HEMOGLOBIN (HGB A1C): HbA1c, POC (prediabetic range): 6 % (ref 5.7–6.4)

## 2022-12-05 MED ORDER — METFORMIN HCL 1000 MG PO TABS: 1000.0000 mg | ORAL_TABLET | Freq: Two times a day (BID) | ORAL | 2 refills | Status: AC

## 2022-12-05 MED ORDER — NORGESTIM-ETH ESTRAD TRIPHASIC 0.18/0.215/0.25 MG-25 MCG PO TABS: ORAL_TABLET | ORAL | 11 refills | Status: AC

## 2022-12-05 NOTE — Progress Notes (Signed)
Pt presents for medication refills -needs refill on metformin and birth control pills  -been out of both for about 1-2 months

## 2023-06-05 ENCOUNTER — Ambulatory Visit: Payer: Self-pay | Admitting: Family Medicine
